# Patient Record
Sex: Female | Born: 1970 | Race: White | Hispanic: No | Marital: Married | Smoking: Never smoker
Health system: Southern US, Community
[De-identification: ages and names within clinical notes are randomized; demographics above are authoritative.]

## PROBLEM LIST (undated history)

## (undated) DIAGNOSIS — F32A Depression, unspecified: Secondary | ICD-10-CM

## (undated) DIAGNOSIS — D649 Anemia, unspecified: Secondary | ICD-10-CM

## (undated) HISTORY — PX: CHOLECYSTECTOMY: SHX55

---

## 1995-07-14 HISTORY — PX: BREAST EXCISIONAL BIOPSY: SUR124

## 2004-11-14 ENCOUNTER — Ambulatory Visit: Payer: Self-pay

## 2008-07-13 HISTORY — PX: BREAST BIOPSY: SHX20

## 2011-01-07 ENCOUNTER — Ambulatory Visit: Payer: Self-pay

## 2011-01-26 ENCOUNTER — Ambulatory Visit: Payer: Self-pay

## 2013-01-26 ENCOUNTER — Ambulatory Visit: Payer: Self-pay | Admitting: Orthopedic Surgery

## 2014-07-13 HISTORY — PX: BREAST BIOPSY: SHX20

## 2015-02-26 ENCOUNTER — Other Ambulatory Visit: Payer: Self-pay | Admitting: Unknown Physician Specialty

## 2015-02-26 DIAGNOSIS — N6321 Unspecified lump in the left breast, upper outer quadrant: Secondary | ICD-10-CM

## 2015-03-04 ENCOUNTER — Ambulatory Visit: Payer: Self-pay

## 2015-03-04 ENCOUNTER — Ambulatory Visit
Admission: RE | Admit: 2015-03-04 | Discharge: 2015-03-04 | Disposition: A | Discharge status: Home / Self Care | Payer: Self-pay | Source: Ambulatory Visit | Attending: Unknown Physician Specialty | Admitting: Unknown Physician Specialty

## 2015-03-04 DIAGNOSIS — N6321 Unspecified lump in the left breast, upper outer quadrant: Secondary | ICD-10-CM

## 2015-03-04 DIAGNOSIS — Z803 Family history of malignant neoplasm of breast: Secondary | ICD-10-CM | POA: Insufficient documentation

## 2015-03-04 DIAGNOSIS — N63 Unspecified lump in breast: Secondary | ICD-10-CM | POA: Insufficient documentation

## 2015-03-05 ENCOUNTER — Other Ambulatory Visit: Payer: Self-pay | Admitting: Unknown Physician Specialty

## 2015-03-05 DIAGNOSIS — R928 Other abnormal and inconclusive findings on diagnostic imaging of breast: Secondary | ICD-10-CM

## 2015-03-05 DIAGNOSIS — N63 Unspecified lump in unspecified breast: Secondary | ICD-10-CM

## 2015-03-06 ENCOUNTER — Other Ambulatory Visit: Payer: Self-pay | Admitting: Oncology

## 2015-03-06 ENCOUNTER — Other Ambulatory Visit: Payer: Self-pay

## 2015-03-06 ENCOUNTER — Ambulatory Visit: Payer: Self-pay | Attending: Oncology

## 2015-03-06 VITALS — BP 110/77 | HR 83 | Temp 99.4°F | Resp 18 | Ht 63.78 in | Wt 186.5 lb

## 2015-03-06 DIAGNOSIS — N63 Unspecified lump in unspecified breast: Secondary | ICD-10-CM

## 2015-03-06 DIAGNOSIS — Z0000 Encounter for general adult medical examination without abnormal findings: Secondary | ICD-10-CM

## 2015-03-06 DIAGNOSIS — R928 Other abnormal and inconclusive findings on diagnostic imaging of breast: Secondary | ICD-10-CM

## 2015-03-06 NOTE — Progress Notes (Addendum)
Subjective:     Patient ID: Kim Fischer, female   DOB: [birth year], 44 y.o.   MRN: [id number]  HPI   Review of Systems     Objective:   Physical Exam  Pulmonary/Chest: Right breast exhibits no inverted nipple, no mass, no nipple discharge, no skin change and no tenderness. Left breast exhibits no inverted nipple, no mass, no nipple discharge, no skin change and no tenderness. Breasts are asymmetrical.  Right breast 1 cup larger than left breast.  Genitourinary: No labial fusion. There is no rash, tenderness, lesion or injury on the right labia. There is no rash, tenderness, lesion or injury on the left labia. Uterus is not deviated, not enlarged, not fixed and not tender. Cervix exhibits friability. Right adnexum displays no mass, no tenderness and no fullness. Left adnexum displays no mass, no tenderness and no fullness. No erythema, tenderness or bleeding in the vagina. No foreign body around the vagina. No signs of injury around the vagina. No vaginal discharge found.    3 areas of excoriation on face of cervix vs. Nabothian cysts. Small amount of bleeding on specimen collection.       Assessment:     44 year old patient presents for BCCCP clinic visit after having [facility] mammogram yesterday.  .  No mass or lump palpated.  Patient screened, and meets BCCCP eligibility.  Patient does not have insurance, Medicare or Medicaid.  Handout given on Affordable Care Act.CBE unremarkableInstructed patient on breast self-exam using teach back method.  Pelvic exam reveals 3 excoriated areas on face of cervix vs. Nabothian cysts. Mild bleeding on specimen collection.     Plan:     Specimen collected for pap. Scheduled for left breast biopsy 03/06/16 at the [facility].

## 2015-03-07 ENCOUNTER — Ambulatory Visit: Payer: Self-pay

## 2015-03-07 ENCOUNTER — Ambulatory Visit: Admission: RE | Admit: 2015-03-07 | Payer: Self-pay | Source: Ambulatory Visit

## 2015-03-07 ENCOUNTER — Ambulatory Visit
Admission: RE | Admit: 2015-03-07 | Discharge: 2015-03-07 | Disposition: A | Discharge status: Home / Self Care | Payer: Self-pay | Source: Ambulatory Visit | Attending: Oncology | Admitting: Oncology

## 2015-03-07 DIAGNOSIS — N63 Unspecified lump in unspecified breast: Secondary | ICD-10-CM

## 2015-03-07 DIAGNOSIS — R928 Other abnormal and inconclusive findings on diagnostic imaging of breast: Secondary | ICD-10-CM

## 2015-03-08 LAB — SURGICAL PATHOLOGY

## 2015-03-12 LAB — PAP LB AND HPV HIGH-RISK
HPV, HIGH-RISK: NEGATIVE
PAP Smear Comment: 0

## 2015-03-19 NOTE — Progress Notes (Signed)
Received benign breast biopsy result.  Pap results ASCUS with negative HPV.  Per BCCCP guidelines patient would return to routine screening, but due to findings on pelvic exam, she is being referred for consult  to  Farrel Conners CNM at [facility].  Her appointment is scheduled for Monday March 25, 2015 at 8:30.  Patient aware of appointment.

## 2015-03-29 ENCOUNTER — Telehealth: Payer: Self-pay

## 2015-03-29 NOTE — Telephone Encounter (Signed)
Phoned patient as follow-up to abnormal cervical exam.  Per patient Farrel Conners NP confirmed nabothian cysts, and recommended repeat pap in 1 year. Will request notes from [facility] clinic.  Patient to schedule appointment for annual screening in one year.

## 2015-04-18 NOTE — Progress Notes (Signed)
Received letter from Farrel Conners CNM stating patient will need repeat pap in one year.

## 2016-01-27 ENCOUNTER — Other Ambulatory Visit: Payer: Self-pay | Admitting: Certified Nurse Midwife

## 2016-01-27 ENCOUNTER — Encounter: Payer: Self-pay | Admitting: *Deleted

## 2016-01-27 DIAGNOSIS — Z1231 Encounter for screening mammogram for malignant neoplasm of breast: Secondary | ICD-10-CM

## 2016-01-27 NOTE — Progress Notes (Signed)
  Oncology Nurse Navigator Documentation  Navigator Location: [facility] (01/27/16 1600) Navigator Encounter Type: Telephone (01/27/16 1600) Telephone: Incoming Call (01/27/16 1600)             Barriers/Navigation Needs: Coordination of Care (01/27/16 1600)   Interventions: Referrals (free mammo through our [facility]) (01/27/16 1600)                      Time Spent with Patient: 30 (01/27/16 1600)   Patient approved for a free mammogram through our [facility].  Message sent to scheduling in mammo.  Patient to call and scheduled an appointment.

## 2016-03-04 ENCOUNTER — Other Ambulatory Visit: Payer: Self-pay | Admitting: Certified Nurse Midwife

## 2016-03-04 ENCOUNTER — Ambulatory Visit
Admission: RE | Admit: 2016-03-04 | Discharge: 2016-03-04 | Disposition: A | Discharge status: Home / Self Care | Payer: Self-pay | Source: Ambulatory Visit | Attending: Certified Nurse Midwife | Admitting: Certified Nurse Midwife

## 2016-03-04 DIAGNOSIS — Z1231 Encounter for screening mammogram for malignant neoplasm of breast: Secondary | ICD-10-CM

## 2016-03-11 ENCOUNTER — Other Ambulatory Visit: Payer: Self-pay | Admitting: Certified Nurse Midwife

## 2016-03-11 DIAGNOSIS — R921 Mammographic calcification found on diagnostic imaging of breast: Secondary | ICD-10-CM

## 2016-03-26 ENCOUNTER — Ambulatory Visit
Admission: RE | Admit: 2016-03-26 | Discharge: 2016-03-26 | Disposition: A | Discharge status: Home / Self Care | Payer: Self-pay | Source: Ambulatory Visit | Attending: Certified Nurse Midwife | Admitting: Certified Nurse Midwife

## 2016-03-26 DIAGNOSIS — R921 Mammographic calcification found on diagnostic imaging of breast: Secondary | ICD-10-CM

## 2016-04-24 ENCOUNTER — Other Ambulatory Visit: Payer: Self-pay | Admitting: Certified Nurse Midwife

## 2016-04-24 DIAGNOSIS — R921 Mammographic calcification found on diagnostic imaging of breast: Secondary | ICD-10-CM

## 2016-04-28 ENCOUNTER — Encounter: Payer: Self-pay | Admitting: *Deleted

## 2016-04-28 NOTE — Progress Notes (Signed)
Received call from Denyse Dago at [facility] that the patient was interested in genetic testing, but was uninsured.  She wanted to know if we had any programs available and would I give the patient a call.  Talked to patient today.  Based on NCCN guidelines for unaffected patient, she still meets guidelines for testing since her mother was diagnosed with 2 primary breast cancers.  Reviewed statistics and that there is financial assistance through [employer].  She would have to complete a form and send a W-2 to see if she qualifies.  Explained that we typically send it in at the time of testing, and if not approved, the specimen can be destroyed by [facility].  Informed her that she would have to have a doc or CNM to have the results sent to.  Informed her that Dr. Grayland Ormond did see high risk patient, but that there would be charge, or she could follow with Dalia Heading, CNM and discuss with her.  Told her that if [facility] was not sure how to find the financial assistance info I would be glad to walk them through it.  She is to call if she has any questions or needs.

## 2016-09-24 ENCOUNTER — Ambulatory Visit
Admission: RE | Admit: 2016-09-24 | Discharge: 2016-09-24 | Disposition: A | Discharge status: Home / Self Care | Payer: Self-pay | Source: Ambulatory Visit | Attending: Certified Nurse Midwife | Admitting: Certified Nurse Midwife

## 2016-09-24 ENCOUNTER — Other Ambulatory Visit: Payer: Self-pay | Admitting: Certified Nurse Midwife

## 2016-09-24 DIAGNOSIS — R921 Mammographic calcification found on diagnostic imaging of breast: Secondary | ICD-10-CM

## 2018-02-01 ENCOUNTER — Other Ambulatory Visit: Payer: Self-pay | Admitting: Internal Medicine

## 2018-02-01 DIAGNOSIS — R921 Mammographic calcification found on diagnostic imaging of breast: Secondary | ICD-10-CM

## 2018-02-17 ENCOUNTER — Ambulatory Visit
Admission: RE | Admit: 2018-02-17 | Discharge: 2018-02-17 | Disposition: A | Discharge status: Home / Self Care | Payer: Self-pay | Source: Ambulatory Visit | Attending: Internal Medicine | Admitting: Internal Medicine

## 2018-02-17 DIAGNOSIS — R921 Mammographic calcification found on diagnostic imaging of breast: Secondary | ICD-10-CM | POA: Insufficient documentation

## 2019-05-12 ENCOUNTER — Other Ambulatory Visit: Payer: Self-pay | Admitting: Internal Medicine

## 2019-05-12 DIAGNOSIS — Z1231 Encounter for screening mammogram for malignant neoplasm of breast: Secondary | ICD-10-CM

## 2019-08-07 ENCOUNTER — Ambulatory Visit
Admission: RE | Admit: 2019-08-07 | Discharge: 2019-08-07 | Disposition: A | Discharge status: Home / Self Care | Payer: Managed Care, Other (non HMO) | Source: Ambulatory Visit | Attending: Internal Medicine | Admitting: Internal Medicine

## 2019-08-07 DIAGNOSIS — Z1231 Encounter for screening mammogram for malignant neoplasm of breast: Secondary | ICD-10-CM | POA: Diagnosis present

## 2020-10-23 ENCOUNTER — Other Ambulatory Visit: Payer: Self-pay | Admitting: Internal Medicine

## 2020-10-23 DIAGNOSIS — Z1231 Encounter for screening mammogram for malignant neoplasm of breast: Secondary | ICD-10-CM

## 2020-11-27 ENCOUNTER — Other Ambulatory Visit: Payer: Self-pay

## 2020-11-27 ENCOUNTER — Ambulatory Visit
Admission: RE | Admit: 2020-11-27 | Discharge: 2020-11-27 | Disposition: A | Discharge status: Home / Self Care | Payer: Managed Care, Other (non HMO) | Source: Ambulatory Visit | Attending: Internal Medicine | Admitting: Internal Medicine

## 2020-11-27 DIAGNOSIS — Z1231 Encounter for screening mammogram for malignant neoplasm of breast: Secondary | ICD-10-CM | POA: Insufficient documentation

## 2021-12-26 ENCOUNTER — Other Ambulatory Visit: Payer: Self-pay | Admitting: Internal Medicine

## 2021-12-26 DIAGNOSIS — Z1231 Encounter for screening mammogram for malignant neoplasm of breast: Secondary | ICD-10-CM

## 2022-01-22 ENCOUNTER — Ambulatory Visit
Admission: RE | Admit: 2022-01-22 | Discharge: 2022-01-22 | Disposition: A | Discharge status: Home / Self Care | Payer: 59 | Source: Ambulatory Visit | Attending: Internal Medicine | Admitting: Internal Medicine

## 2022-01-22 DIAGNOSIS — Z1231 Encounter for screening mammogram for malignant neoplasm of breast: Secondary | ICD-10-CM | POA: Diagnosis present

## 2022-01-23 ENCOUNTER — Ambulatory Visit (INDEPENDENT_AMBULATORY_CARE_PROVIDER_SITE_OTHER): Payer: 59 | Admitting: Plastic Surgery

## 2022-01-23 VITALS — BP 132/88 | HR 102 | Ht 62.0 in | Wt 198.2 lb

## 2022-01-23 DIAGNOSIS — M546 Pain in thoracic spine: Secondary | ICD-10-CM

## 2022-01-23 DIAGNOSIS — N62 Hypertrophy of breast: Secondary | ICD-10-CM

## 2022-01-23 DIAGNOSIS — M549 Dorsalgia, unspecified: Secondary | ICD-10-CM | POA: Diagnosis not present

## 2022-01-23 DIAGNOSIS — M542 Cervicalgia: Secondary | ICD-10-CM | POA: Diagnosis not present

## 2022-01-23 DIAGNOSIS — Z803 Family history of malignant neoplasm of breast: Secondary | ICD-10-CM

## 2022-01-23 NOTE — Progress Notes (Signed)
Referring Provider Danella Penton, MD [address] [facility] [place],  [place] [address]   CC:  Breast hypertrophy and back pain   Kim Fischer is an 51 y.o. female.  HPI:   The patient is a 51 y.o. female with a history of mammary hyperplasia for several years.  She has extremely large breasts causing symptoms that include the following: Back pain in the upper and lower back, including neck pain. She pulls or pins her bra straps to provide better lift and relief of the pressure and pain. She notices relief by holding her breast up manually.  Her shoulder straps cause grooves and pain and pressure that requires padding for relief. Pain medication is sometimes required with motrin and tylenol.  Activities that are hindered by enlarged breasts include: exercise and running.  She has tried supportive clothing as well as fitted bras without improvement.     Mammogram history: completed yeasterday [facility], normal.  Family history of breast cancer:  mother - deceased from breat cancer.  Tobacco use:  none.   The patient expresses the desire to pursue surgical intervention.  The BMI = 36.  Preoperative bra size = DDD cup.   The patient is also interested in abdominal liposuction, primarily centrally.   No Known Allergies  Outpatient Encounter Medications as of 01/23/2022  Medication Sig   ADVAIR DISKUS 100-50 MCG/ACT AEPB SMARTSIG:1 inhalation Via Inhaler Every 12 Hours   ALPRAZolam (XANAX) 1 MG tablet Take 1 mg by mouth at bedtime.   levothyroxine (SYNTHROID) 100 MCG tablet Take by mouth.   pantoprazole (PROTONIX) 40 MG tablet Take 40 mg by mouth daily.   pramipexole (MIRAPEX) 0.25 MG tablet TAKE 2 TABLETS (0.5 MG TOTAL) BY MOUTH NIGHTLY   No facility-administered encounter medications on file as of 01/23/2022.     No past medical history on file.  Past Surgical History:  Procedure Laterality Date   BREAST BIOPSY Right 2010   negative   BREAST BIOPSY  Left 2016   NEG   BREAST EXCISIONAL BIOPSY Right 1997   negative    Family History  Problem Relation Age of Onset   Breast cancer Mother 10       and again at 11    Social History   Social History Narrative   Not on file     Review of Systems General: Denies fevers, chills, weight loss CV: Denies chest pain, shortness of breath, palpitations   Physical Exam    01/23/2022    9:56 AM 03/06/2015   12:52 PM  Vitals with BMI  Height 5\' 2"  5' 3.78"  Weight 198 lbs 3 oz 186 lbs 8 oz  BMI 36.24 32.3  Systolic 132 110  Diastolic 88 77  Pulse 102 83    General:  No acute distress,  Alert and oriented, Non-Toxic, Normal speech and affect Breast: No easily palpable breast masses on physical exam, significant breast ptosis and macromastia. Her breasts are extremely large and fairly symmetric with the right larger.  She has hyperpigmentation of the inframammary area on both sides.  The sternal to nipple distance on the right is 36 cm and the left is 36 cm.  The IMF distance is 17 cm on the right and 16.5 cm on the left.  Base width is 19 bilaterally. Abdomen: Some adipose subcutaneous primarily centrally  Assessment/Plan   The patient has bilateral symptomatic macromastia.  She is a good candidate for a breast reduction.  She is  interested in pursuing surgical treatment.  She has tried supportive garments and fitted bras with no relief.  The details of breast reduction surgery were discussed.  I explained the procedure in detail along the with the expected scars.  The risks were discussed in detail and include bleeding, infection, damage to surrounding structures, need for additional procedures, nipple loss, change in nipple sensation, persistent pain, contour irregularities and asymmetries.  I explained that breast feeding is often not possible after breast reduction surgery.  We discussed the expected postoperative course with an overall recovery period of about 1 month.  She  demonstrated full understanding of all risks.  We discussed her personal risk factors that include high BMI.  The patient is interested in pursuing surgical treatment.  The estimated excess breast tissue to be removed at the time of surgery = 650 grams on the left and 650 grams on the right.   In addition the patient is a candidate for abdominal liposuction primarily central abdomen.  She is aware that liposuction would not be covered by insurance. Janne Napoleon 01/23/2022, 10:37 AM

## 2022-02-11 ENCOUNTER — Encounter: Payer: Self-pay | Admitting: *Deleted

## 2022-02-19 ENCOUNTER — Telehealth: Payer: Self-pay | Admitting: Plastic Surgery

## 2022-02-19 NOTE — Telephone Encounter (Signed)
T [facility] [phone]=  BIL Breast REd N62 does not need auth for [id number] approval # [id number]

## 2022-03-02 ENCOUNTER — Telehealth: Payer: Self-pay | Admitting: Plastic Surgery

## 2022-03-02 NOTE — Telephone Encounter (Signed)
[facility] - READY TO SCHEDULE SURGERY 

## 2022-04-02 NOTE — Progress Notes (Signed)
   Patient ID: Kim Fischer, female    DOB: 01/27/1971, 51 y.o.   MRN: [id number]  Chief Complaint  Patient presents with   Pre-op Exam      ICD-10-CM   1. Breast hypertrophy  N62        History of Present Illness: Kim Fischer is a 51 y.o.  female  with a history of mammary hyperplasia.  She presents for preoperative evaluation for upcoming procedure, bilateral breast reduction, scheduled for 04/14/2022 with Dr.  Luppens .  The patient has not had problems with anesthesia.  Previous cholecystectomy without complication from anesthesia.  She denies any personal or family history of blood clots or clotting disorder.  No personal history of cancer, MI, CVA, or other cardiopulmonary disease.  She does have mild exercise induced asthma, but well controlled.  She denies any smoking or other nicotine-containing products.  She confirms that she is a DDD cup and would like to be a C cup postoperatively.  She reports that she would prefer a small C, but understands limitations of surgery based on her current size.  She states that her right breast is much larger than the left and nipple is deviated outward.  Summary of Previous Visit: She was seen for initial consult on 01/23/2022 Dr. Luppens.  At that time, complained of symptomatic mammary hyperplasia.  BMI equals 36 kg/m.  Preoperative bra size equals DDD cup.  She also expressed interest in abdominal liposuction.  STN 36 cm each side.  Base width 19 cm.  Estimated excess breast tissue removed at time of surgical 650 g each side.  Patient was provided quote for abdominal liposuction which she understood would not be covered by insurance.  Current plan is just for the mammary reduction.  Could revisit liposuction at a later date.  Job: Works at church, predominantly a desk position.  Discussed 2 to 3 weeks STD with limitations upon return.  PMH Significant for: Macromastia, thyroid disorder, GERD, RLS.   Past Medical  History: Allergies: No Known Allergies  Current Medications:  Current Outpatient Medications:    ADVAIR DISKUS 100-50 MCG/ACT AEPB, SMARTSIG:1 inhalation Via Inhaler Every 12 Hours, Disp: , Rfl:    ALPRAZolam (XANAX) 1 MG tablet, Take 1 mg by mouth at bedtime., Disp: , Rfl:    ferrous gluconate (FERGON) 324 MG tablet, Take 324 mg by mouth every morning., Disp: , Rfl:    levothyroxine (SYNTHROID) 100 MCG tablet, Take by mouth., Disp: , Rfl:    pantoprazole (PROTONIX) 40 MG tablet, Take 40 mg by mouth daily., Disp: , Rfl:    pramipexole (MIRAPEX) 0.25 MG tablet, TAKE 2 TABLETS (0.5 MG TOTAL) BY MOUTH NIGHTLY, Disp: , Rfl:    venlafaxine XR (EFFEXOR-XR) 75 MG 24 hr capsule, Take 75 mg by mouth daily., Disp: , Rfl:   Past Medical Problems: No past medical history on file.  Past Surgical History: Past Surgical History:  Procedure Laterality Date   BREAST BIOPSY Right 2010   negative   BREAST BIOPSY Left 2016   NEG   BREAST EXCISIONAL BIOPSY Right 1997   negative    Social History: Social History   Socioeconomic History   Marital status: Married    Spouse name: Not on file   Number of children: Not on file   Years of education: Not on file   Highest education level: Not on file  Occupational History   Not on file  Tobacco Use   Smoking status: Never     Smokeless tobacco: Never  Substance and Sexual Activity   Alcohol use: Not on file   Drug use: Not on file   Sexual activity: Not on file  Other Topics Concern   Not on file  Social History Narrative   Not on file   Social Determinants of Health   Financial Resource Strain: Not on file  Food Insecurity: Not on file  Transportation Needs: Not on file  Physical Activity: Not on file  Stress: Not on file  Social Connections: Not on file  Intimate Partner Violence: Not on file    Family History: Family History  Problem Relation Age of Onset   Breast cancer Mother 98       and again at 30    Review of  Systems: ROS Denies any recent chest pain, difficulty breathing, leg swelling, fevers, or trauma.  Physical Exam: Vital Signs BP 125/67 (BP Location: Left Arm, Patient Position: Sitting, Cuff Size: Large)   Pulse 81   Ht 5\' 2"  (1.575 m)   Wt 198 lb 9.6 oz (90.1 kg)   SpO2 98%   BMI 36.32 kg/m   Physical Exam Constitutional:      General: Not in acute distress.    Appearance: Normal appearance. Not ill-appearing.  HENT:     Head: Normocephalic and atraumatic.  Eyes:     Pupils: Pupils are equal, round. Cardiovascular:     Rate and Rhythm: Normal rate.    Pulses: Normal pulses.  Pulmonary:     Effort: No respiratory distress or increased work of breathing.  Speaks in full sentences. Abdominal:     General: Abdomen is flat. No distension.   Musculoskeletal: Normal range of motion. No lower extremity swelling or edema.  Skin:    General: Skin is warm and dry.     Findings: No erythema or rash.  Neurological:     Mental Status: Alert and oriented to person, place, and time.  Psychiatric:        Mood and Affect: Mood normal.        Behavior: Behavior normal.    Assessment/Plan: The patient is scheduled for bilateral breast reduction with Dr. Erin Hearing.  Risks, benefits, and alternatives of procedure discussed, questions answered and consent obtained.    Smoking Status: Non-smoker. Last Mammogram: 01/2022; Results: BI-RADS Category 1: Negative.  Caprini Score: 4; Risk Factors include: Age, BMI greater than 25, and length of planned surgery. Recommendation for mechanical prophylaxis. Encourage early ambulation.   Pictures obtained: 01/23/2022  Post-op Rx sent to pharmacy: Oxycodone and Zofran.  Patient was provided with the General Surgical Risk consent document and Pain Medication Agreement prior to their appointment.  They had adequate time to read through the risk consent documents and Pain Medication Agreement. We also discussed them in person together during this preop  appointment. All of their questions were answered to their satisfaction.  Recommended calling if they have any further questions.  Risk consent form and Pain Medication Agreement to be scanned into patient's chart.  The risk that can be encountered with breast reduction were discussed and include the following but not limited to these:  Breast asymmetry, fluid accumulation, firmness of the breast, inability to breast feed, loss of nipple or areola, skin loss, decrease or no nipple sensation, fat necrosis of the breast tissue, bleeding, infection, healing delay.  There are risks of anesthesia, changes to skin sensation and injury to nerves or blood vessels.  The muscle can be temporarily or permanently injured.  You may  have an allergic reaction to tape, suture, glue, blood products which can result in skin discoloration, swelling, pain, skin lesions, poor healing.  Any of these can lead to the need for revisonal surgery or stage procedures.  A reduction has potential to interfere with diagnostic procedures.  Nipple or breast piercing can increase risks of infection.  This procedure is best done when the breast is fully developed.  Changes in the breast will continue to occur over time.  Pregnancy can alter the outcomes of previous breast reduction surgery, weight gain and weigh loss can also effect the long term appearance.     Electronically signed by: Sherryn Pollino, PA-C 04/03/2022 9:43 AM 

## 2022-04-02 NOTE — H&P (View-Only) (Signed)
Patient ID: [name], female    DOB: 1971/04/07, 51 y.o.   MRN: [id number]  Chief Complaint  Patient presents with   Pre-op Exam      ICD-10-CM   1. Breast hypertrophy  N62        History of Present Illness: Kim Fischer is a 51 y.o.  female  with a history of mammary hyperplasia.  She presents for preoperative evaluation for upcoming procedure, bilateral breast reduction, scheduled for 04/14/2022 with Dr.  Erin Hearing .  The patient has not had problems with anesthesia.  Previous cholecystectomy without complication from anesthesia.  She denies any personal or family history of blood clots or clotting disorder.  No personal history of cancer, MI, CVA, or other cardiopulmonary disease.  She does have mild exercise induced asthma, but well controlled.  She denies any smoking or other nicotine-containing products.  She confirms that she is a DDD cup and would like to be a C cup postoperatively.  She reports that she would prefer a small C, but understands limitations of surgery based on her current size.  She states that her right breast is much larger than the left and nipple is deviated outward.  Summary of Previous Visit: She was seen for initial consult on 01/23/2022 Dr. Erin Hearing.  At that time, complained of symptomatic mammary hyperplasia.  BMI equals 36 kg/m.  Preoperative bra size equals DDD cup.  She also expressed interest in abdominal liposuction.  STN 36 cm each side.  Base width 19 cm.  Estimated excess breast tissue removed at time of surgical 650 g each side.  Patient was provided quote for abdominal liposuction which she understood would not be covered by insurance.  Current plan is just for the mammary reduction.  Could revisit liposuction at a later date.  Job: Works at [employer], predominantly a Astronomer.  Discussed 2 to 3 weeks STD with limitations upon return.  PMH Significant for: Macromastia, thyroid disorder, GERD, RLS.   Past Medical  History: Allergies: No Known Allergies  Current Medications:  Current Outpatient Medications:    ADVAIR DISKUS 100-50 MCG/ACT AEPB, SMARTSIG:1 inhalation Via Inhaler Every 12 Hours, Disp: , Rfl:    ALPRAZolam (XANAX) 1 MG tablet, Take 1 mg by mouth at bedtime., Disp: , Rfl:    ferrous gluconate (FERGON) 324 MG tablet, Take 324 mg by mouth every morning., Disp: , Rfl:    levothyroxine (SYNTHROID) 100 MCG tablet, Take by mouth., Disp: , Rfl:    pantoprazole (PROTONIX) 40 MG tablet, Take 40 mg by mouth daily., Disp: , Rfl:    pramipexole (MIRAPEX) 0.25 MG tablet, TAKE 2 TABLETS (0.5 MG TOTAL) BY MOUTH NIGHTLY, Disp: , Rfl:    venlafaxine XR (EFFEXOR-XR) 75 MG 24 hr capsule, Take 75 mg by mouth daily., Disp: , Rfl:   Past Medical Problems: No past medical history on file.  Past Surgical History: Past Surgical History:  Procedure Laterality Date   BREAST BIOPSY Right 2010   negative   BREAST BIOPSY Left 2016   NEG   BREAST EXCISIONAL BIOPSY Right 1997   negative    Social History: Social History   Socioeconomic History   Marital status: Married    Spouse name: Not on file   Number of children: Not on file   Years of education: Not on file   Highest education level: Not on file  Occupational History   Not on file  Tobacco Use   Smoking status: Never  Smokeless tobacco: Never  Substance and Sexual Activity   Alcohol use: Not on file   Drug use: Not on file   Sexual activity: Not on file  Other Topics Concern   Not on file  Social History Narrative   Not on file   Social Determinants of Health   Financial Resource Strain: Not on file  Food Insecurity: Not on file  Transportation Needs: Not on file  Physical Activity: Not on file  Stress: Not on file  Social Connections: Not on file  Intimate Partner Violence: Not on file    Family History: Family History  Problem Relation Age of Onset   Breast cancer Mother 98       and again at 30    Review of  Systems: ROS Denies any recent chest pain, difficulty breathing, leg swelling, fevers, or trauma.  Physical Exam: Vital Signs BP 125/67 (BP Location: Left Arm, Patient Position: Sitting, Cuff Size: Large)   Pulse 81   Ht 5\' 2"  (1.575 m)   Wt 198 lb 9.6 oz (90.1 kg)   SpO2 98%   BMI 36.32 kg/m   Physical Exam Constitutional:      General: Not in acute distress.    Appearance: Normal appearance. Not ill-appearing.  HENT:     Head: Normocephalic and atraumatic.  Eyes:     Pupils: Pupils are equal, round. Cardiovascular:     Rate and Rhythm: Normal rate.    Pulses: Normal pulses.  Pulmonary:     Effort: No respiratory distress or increased work of breathing.  Speaks in full sentences. Abdominal:     General: Abdomen is flat. No distension.   Musculoskeletal: Normal range of motion. No lower extremity swelling or edema.  Skin:    General: Skin is warm and dry.     Findings: No erythema or rash.  Neurological:     Mental Status: Alert and oriented to person, place, and time.  Psychiatric:        Mood and Affect: Mood normal.        Behavior: Behavior normal.    Assessment/Plan: The patient is scheduled for bilateral breast reduction with Dr. Erin Hearing.  Risks, benefits, and alternatives of procedure discussed, questions answered and consent obtained.    Smoking Status: Non-smoker. Last Mammogram: 01/2022; Results: BI-RADS Category 1: Negative.  Caprini Score: 4; Risk Factors include: Age, BMI greater than 25, and length of planned surgery. Recommendation for mechanical prophylaxis. Encourage early ambulation.   Pictures obtained: 01/23/2022  Post-op Rx sent to pharmacy: Oxycodone and Zofran.  Patient was provided with the General Surgical Risk consent document and Pain Medication Agreement prior to their appointment.  They had adequate time to read through the risk consent documents and Pain Medication Agreement. We also discussed them in person together during this preop  appointment. All of their questions were answered to their satisfaction.  Recommended calling if they have any further questions.  Risk consent form and Pain Medication Agreement to be scanned into patient's chart.  The risk that can be encountered with breast reduction were discussed and include the following but not limited to these:  Breast asymmetry, fluid accumulation, firmness of the breast, inability to breast feed, loss of nipple or areola, skin loss, decrease or no nipple sensation, fat necrosis of the breast tissue, bleeding, infection, healing delay.  There are risks of anesthesia, changes to skin sensation and injury to nerves or blood vessels.  The muscle can be temporarily or permanently injured.  You may  have an allergic reaction to tape, suture, glue, blood products which can result in skin discoloration, swelling, pain, skin lesions, poor healing.  Any of these can lead to the need for revisonal surgery or stage procedures.  A reduction has potential to interfere with diagnostic procedures.  Nipple or breast piercing can increase risks of infection.  This procedure is best done when the breast is fully developed.  Changes in the breast will continue to occur over time.  Pregnancy can alter the outcomes of previous breast reduction surgery, weight gain and weigh loss can also effect the long term appearance.     Electronically signed by: Krista Blue, PA-C 04/03/2022 9:43 AM

## 2022-04-03 ENCOUNTER — Encounter: Payer: Self-pay | Admitting: Physician Assistant

## 2022-04-03 ENCOUNTER — Ambulatory Visit (INDEPENDENT_AMBULATORY_CARE_PROVIDER_SITE_OTHER): Payer: 59 | Admitting: Physician Assistant

## 2022-04-03 VITALS — BP 125/67 | HR 81 | Ht 62.0 in | Wt 198.6 lb

## 2022-04-03 DIAGNOSIS — N62 Hypertrophy of breast: Secondary | ICD-10-CM

## 2022-04-03 MED ORDER — OXYCODONE HCL 5 MG PO TABS: 5.0000 mg | ORAL_TABLET | Freq: Four times a day (QID) | ORAL | 0 refills | Status: AC | PRN

## 2022-04-03 MED ORDER — ONDANSETRON 4 MG PO TBDP: 4.0000 mg | ORAL_TABLET | Freq: Three times a day (TID) | ORAL | 0 refills | Status: DC | PRN

## 2022-04-06 ENCOUNTER — Encounter (HOSPITAL_BASED_OUTPATIENT_CLINIC_OR_DEPARTMENT_OTHER): Payer: Self-pay | Admitting: Plastic Surgery

## 2022-04-06 ENCOUNTER — Other Ambulatory Visit: Payer: Self-pay

## 2022-04-12 HISTORY — PX: REDUCTION MAMMAPLASTY: SUR839

## 2022-04-14 ENCOUNTER — Other Ambulatory Visit: Payer: Self-pay

## 2022-04-14 ENCOUNTER — Ambulatory Visit (HOSPITAL_BASED_OUTPATIENT_CLINIC_OR_DEPARTMENT_OTHER): Payer: 59 | Admitting: Anesthesiology

## 2022-04-14 ENCOUNTER — Ambulatory Visit (HOSPITAL_BASED_OUTPATIENT_CLINIC_OR_DEPARTMENT_OTHER)
Admission: RE | Admit: 2022-04-14 | Discharge: 2022-04-14 | Disposition: A | Discharge status: Home / Self Care | Payer: 59 | Attending: Plastic Surgery | Admitting: Plastic Surgery

## 2022-04-14 ENCOUNTER — Encounter (HOSPITAL_BASED_OUTPATIENT_CLINIC_OR_DEPARTMENT_OTHER): Payer: Self-pay | Admitting: Plastic Surgery

## 2022-04-14 ENCOUNTER — Encounter (HOSPITAL_BASED_OUTPATIENT_CLINIC_OR_DEPARTMENT_OTHER)
Admission: RE | Disposition: A | Discharge status: Home / Self Care | Payer: Self-pay | Source: Home / Self Care | Attending: Plastic Surgery

## 2022-04-14 DIAGNOSIS — K219 Gastro-esophageal reflux disease without esophagitis: Secondary | ICD-10-CM | POA: Insufficient documentation

## 2022-04-14 DIAGNOSIS — G2581 Restless legs syndrome: Secondary | ICD-10-CM | POA: Diagnosis not present

## 2022-04-14 DIAGNOSIS — N62 Hypertrophy of breast: Secondary | ICD-10-CM | POA: Insufficient documentation

## 2022-04-14 DIAGNOSIS — E669 Obesity, unspecified: Secondary | ICD-10-CM | POA: Insufficient documentation

## 2022-04-14 DIAGNOSIS — E039 Hypothyroidism, unspecified: Secondary | ICD-10-CM | POA: Insufficient documentation

## 2022-04-14 DIAGNOSIS — Z01818 Encounter for other preprocedural examination: Secondary | ICD-10-CM

## 2022-04-14 DIAGNOSIS — J45990 Exercise induced bronchospasm: Secondary | ICD-10-CM | POA: Diagnosis not present

## 2022-04-14 DIAGNOSIS — Z9049 Acquired absence of other specified parts of digestive tract: Secondary | ICD-10-CM | POA: Diagnosis not present

## 2022-04-14 DIAGNOSIS — Z6836 Body mass index (BMI) 36.0-36.9, adult: Secondary | ICD-10-CM | POA: Insufficient documentation

## 2022-04-14 HISTORY — PX: BREAST REDUCTION SURGERY: SHX8

## 2022-04-14 LAB — POCT PREGNANCY, URINE: Preg Test, Ur: NEGATIVE

## 2022-04-14 SURGERY — MAMMOPLASTY, REDUCTION
Anesthesia: General | Site: Breast | Laterality: Bilateral

## 2022-04-14 MED ORDER — HYDROMORPHONE HCL 1 MG/ML IJ SOLN
0.2500 mg | INTRAMUSCULAR | Status: DC | PRN
  Administered 2022-04-14: 0.25 mg via INTRAVENOUS
  Administered 2022-04-14: 0.5 mg via INTRAVENOUS

## 2022-04-14 MED ORDER — CHLORHEXIDINE GLUCONATE CLOTH 2 % EX PADS: 6.0000 | MEDICATED_PAD | Freq: Once | CUTANEOUS | Status: DC

## 2022-04-14 MED ORDER — HYDROMORPHONE HCL 1 MG/ML IJ SOLN
INTRAMUSCULAR | Status: AC
  Filled 2022-04-14: qty 1

## 2022-04-14 MED ORDER — HYDROMORPHONE HCL 1 MG/ML IJ SOLN
INTRAMUSCULAR | Status: AC
  Filled 2022-04-14: qty 0.5

## 2022-04-14 MED ORDER — BACITRACIN ZINC 500 UNIT/GM EX OINT
TOPICAL_OINTMENT | CUTANEOUS | Status: AC
  Filled 2022-04-14: qty 28.35

## 2022-04-14 MED ORDER — ONDANSETRON HCL 4 MG/2ML IJ SOLN: 4.0000 mg | Freq: Once | INTRAMUSCULAR | Status: DC | PRN

## 2022-04-14 MED ORDER — MIDAZOLAM HCL 5 MG/5ML IJ SOLN
INTRAMUSCULAR | Status: DC | PRN
  Administered 2022-04-14: 2 mg via INTRAVENOUS

## 2022-04-14 MED ORDER — LIDOCAINE-EPINEPHRINE 1 %-1:100000 IJ SOLN
INTRAMUSCULAR | Status: AC
  Filled 2022-04-14: qty 1

## 2022-04-14 MED ORDER — ACETAMINOPHEN 10 MG/ML IV SOLN
INTRAVENOUS | Status: DC | PRN
  Administered 2022-04-14: 1000 mg via INTRAVENOUS

## 2022-04-14 MED ORDER — ROCURONIUM BROMIDE 100 MG/10ML IV SOLN
INTRAVENOUS | Status: DC | PRN
  Administered 2022-04-14: 60 mg via INTRAVENOUS
  Administered 2022-04-14: 20 mg via INTRAVENOUS
  Administered 2022-04-14: 10 mg via INTRAVENOUS

## 2022-04-14 MED ORDER — BUPIVACAINE HCL (PF) 0.25 % IJ SOLN
INTRAMUSCULAR | Status: AC
  Filled 2022-04-14: qty 30

## 2022-04-14 MED ORDER — BUPIVACAINE HCL (PF) 0.5 % IJ SOLN
INTRAMUSCULAR | Status: AC
  Filled 2022-04-14: qty 30

## 2022-04-14 MED ORDER — MIDAZOLAM HCL 2 MG/2ML IJ SOLN
INTRAMUSCULAR | Status: AC
  Filled 2022-04-14: qty 2

## 2022-04-14 MED ORDER — BUPIVACAINE-EPINEPHRINE (PF) 0.25% -1:200000 IJ SOLN
INTRAMUSCULAR | Status: AC
  Filled 2022-04-14: qty 30

## 2022-04-14 MED ORDER — KETOROLAC TROMETHAMINE 30 MG/ML IJ SOLN: 30.0000 mg | Freq: Once | INTRAMUSCULAR | Status: DC | PRN

## 2022-04-14 MED ORDER — FENTANYL CITRATE (PF) 100 MCG/2ML IJ SOLN
INTRAMUSCULAR | Status: DC | PRN
  Administered 2022-04-14 (×2): 50 ug via INTRAVENOUS

## 2022-04-14 MED ORDER — DEXAMETHASONE SODIUM PHOSPHATE 10 MG/ML IJ SOLN
INTRAMUSCULAR | Status: AC
  Filled 2022-04-14: qty 1

## 2022-04-14 MED ORDER — OXYCODONE HCL 5 MG PO TABS
5.0000 mg | ORAL_TABLET | Freq: Once | ORAL | Status: AC | PRN
  Administered 2022-04-14: 5 mg via ORAL

## 2022-04-14 MED ORDER — ACETAMINOPHEN 160 MG/5ML PO SOLN: 325.0000 mg | ORAL | Status: DC | PRN

## 2022-04-14 MED ORDER — FENTANYL CITRATE (PF) 100 MCG/2ML IJ SOLN
INTRAMUSCULAR | Status: AC
  Filled 2022-04-14: qty 2

## 2022-04-14 MED ORDER — EPINEPHRINE PF 1 MG/ML IJ SOLN
INTRAMUSCULAR | Status: AC
  Filled 2022-04-14: qty 1

## 2022-04-14 MED ORDER — CEFAZOLIN SODIUM-DEXTROSE 2-4 GM/100ML-% IV SOLN
INTRAVENOUS | Status: AC
  Filled 2022-04-14: qty 100

## 2022-04-14 MED ORDER — PROPOFOL 10 MG/ML IV BOLUS
INTRAVENOUS | Status: AC
  Filled 2022-04-14: qty 20

## 2022-04-14 MED ORDER — MEPERIDINE HCL 25 MG/ML IJ SOLN: 6.2500 mg | INTRAMUSCULAR | Status: DC | PRN

## 2022-04-14 MED ORDER — LIDOCAINE HCL (CARDIAC) PF 100 MG/5ML IV SOSY
PREFILLED_SYRINGE | INTRAVENOUS | Status: DC | PRN
  Administered 2022-04-14: 100 mg via INTRAVENOUS

## 2022-04-14 MED ORDER — 0.9 % SODIUM CHLORIDE (POUR BTL) OPTIME
TOPICAL | Status: DC | PRN
  Administered 2022-04-14: 200 mL

## 2022-04-14 MED ORDER — DEXAMETHASONE SODIUM PHOSPHATE 4 MG/ML IJ SOLN
INTRAMUSCULAR | Status: DC | PRN
  Administered 2022-04-14: 10 mg via INTRAVENOUS

## 2022-04-14 MED ORDER — LACTATED RINGERS IV SOLN
INTRAVENOUS | Status: DC | PRN
  Administered 2022-04-14: 950 mL

## 2022-04-14 MED ORDER — HYDROMORPHONE HCL 1 MG/ML IJ SOLN
INTRAMUSCULAR | Status: DC | PRN
  Administered 2022-04-14 (×2): .5 mg via INTRAVENOUS

## 2022-04-14 MED ORDER — LACTATED RINGERS IV SOLN: INTRAVENOUS | Status: DC

## 2022-04-14 MED ORDER — OXYCODONE HCL 5 MG/5ML PO SOLN: 5.0000 mg | Freq: Once | ORAL | Status: AC | PRN

## 2022-04-14 MED ORDER — SUGAMMADEX SODIUM 200 MG/2ML IV SOLN
INTRAVENOUS | Status: DC | PRN
  Administered 2022-04-14: 200 mg via INTRAVENOUS

## 2022-04-14 MED ORDER — ACETAMINOPHEN 10 MG/ML IV SOLN
INTRAVENOUS | Status: AC
  Filled 2022-04-14: qty 100

## 2022-04-14 MED ORDER — ACETAMINOPHEN 325 MG PO TABS: 325.0000 mg | ORAL_TABLET | ORAL | Status: DC | PRN

## 2022-04-14 MED ORDER — LIDOCAINE 2% (20 MG/ML) 5 ML SYRINGE
INTRAMUSCULAR | Status: AC
  Filled 2022-04-14: qty 5

## 2022-04-14 MED ORDER — OXYCODONE HCL 5 MG PO TABS
ORAL_TABLET | ORAL | Status: AC
  Filled 2022-04-14: qty 1

## 2022-04-14 MED ORDER — ONDANSETRON HCL 4 MG/2ML IJ SOLN
INTRAMUSCULAR | Status: AC
  Filled 2022-04-14: qty 2

## 2022-04-14 MED ORDER — CEFAZOLIN SODIUM-DEXTROSE 2-4 GM/100ML-% IV SOLN
2.0000 g | INTRAVENOUS | Status: AC
  Administered 2022-04-14: 2 g via INTRAVENOUS

## 2022-04-14 MED ORDER — LIDOCAINE HCL (PF) 1 % IJ SOLN
INTRAMUSCULAR | Status: AC
  Filled 2022-04-14: qty 30

## 2022-04-14 MED ORDER — LIDOCAINE-EPINEPHRINE (PF) 1 %-1:200000 IJ SOLN
INTRAMUSCULAR | Status: AC
  Filled 2022-04-14: qty 30

## 2022-04-14 MED ORDER — PROPOFOL 10 MG/ML IV BOLUS
INTRAVENOUS | Status: DC | PRN
  Administered 2022-04-14: 160 mg via INTRAVENOUS

## 2022-04-14 MED ORDER — BUPIVACAINE-EPINEPHRINE (PF) 0.5% -1:200000 IJ SOLN
INTRAMUSCULAR | Status: AC
  Filled 2022-04-14: qty 30

## 2022-04-14 MED ORDER — ONDANSETRON HCL 4 MG/2ML IJ SOLN
INTRAMUSCULAR | Status: DC | PRN
  Administered 2022-04-14: 4 mg via INTRAVENOUS

## 2022-04-14 SURGICAL SUPPLY — 75 items
APL PRP STRL LF DISP 70% ISPRP (MISCELLANEOUS) ×2
APL SKNCLS STERI-STRIP NONHPOA (GAUZE/BANDAGES/DRESSINGS) ×2
BENZOIN TINCTURE PRP APPL 2/3 (GAUZE/BANDAGES/DRESSINGS) ×2 IMPLANT
BINDER BREAST LRG (GAUZE/BANDAGES/DRESSINGS) IMPLANT
BINDER BREAST MEDIUM (GAUZE/BANDAGES/DRESSINGS) IMPLANT
BINDER BREAST XLRG (GAUZE/BANDAGES/DRESSINGS) IMPLANT
BINDER BREAST XXLRG (GAUZE/BANDAGES/DRESSINGS) IMPLANT
BIOPATCH RED 1 DISK 7.0 (GAUZE/BANDAGES/DRESSINGS) IMPLANT
BLADE SURG 10 STRL SS (BLADE) ×4 IMPLANT
BLADE SURG 15 STRL LF DISP TIS (BLADE) ×1 IMPLANT
BLADE SURG 15 STRL SS (BLADE) ×1
BNDG GAUZE DERMACEA FLUFF 4 (GAUZE/BANDAGES/DRESSINGS) ×2 IMPLANT
BNDG GZE DERMACEA 4 6PLY (GAUZE/BANDAGES/DRESSINGS)
CANISTER SUCT 1200ML W/VALVE (MISCELLANEOUS) ×1 IMPLANT
CHLORAPREP W/TINT 26 (MISCELLANEOUS) ×2 IMPLANT
COVER BACK TABLE 60X90IN (DRAPES) ×1 IMPLANT
COVER MAYO STAND STRL (DRAPES) ×1 IMPLANT
DRAIN CHANNEL 15F RND FF W/TCR (WOUND CARE) IMPLANT
DRAPE LAPAROSCOPIC ABDOMINAL (DRAPES) ×1 IMPLANT
DRAPE UTILITY XL STRL (DRAPES) ×1 IMPLANT
DRESSING MEPILEX FLEX 4X4 (GAUZE/BANDAGES/DRESSINGS) ×2 IMPLANT
DRSG MEPILEX FLEX 4X4 (GAUZE/BANDAGES/DRESSINGS) ×2
DRSG MEPILEX POST OP 4X8 (GAUZE/BANDAGES/DRESSINGS) IMPLANT
ELECT COATED BLADE 2.86 ST (ELECTRODE) ×1 IMPLANT
ELECT REM PT RETURN 9FT ADLT (ELECTROSURGICAL) ×2
ELECTRODE REM PT RTRN 9FT ADLT (ELECTROSURGICAL) ×2 IMPLANT
EVACUATOR SILICONE 100CC (DRAIN) IMPLANT
GAUZE PAD ABD 8X10 STRL (GAUZE/BANDAGES/DRESSINGS) ×2 IMPLANT
GAUZE SPONGE 4X4 12PLY STRL (GAUZE/BANDAGES/DRESSINGS) ×2 IMPLANT
GAUZE XEROFORM 5X9 LF (GAUZE/BANDAGES/DRESSINGS) IMPLANT
GLOVE BIO SURGEON STRL SZ8 (GLOVE) IMPLANT
GLOVE BIOGEL M STRL SZ7.5 (GLOVE) ×1 IMPLANT
GLOVE BIOGEL PI IND STRL 7.0 (GLOVE) IMPLANT
GLOVE BIOGEL PI IND STRL 7.5 (GLOVE) ×1 IMPLANT
GLOVE BIOGEL PI IND STRL 8 (GLOVE) IMPLANT
GLOVE SURG SS PI 7.5 STRL IVOR (GLOVE) ×1 IMPLANT
GOWN STRL REUS W/ TWL LRG LVL3 (GOWN DISPOSABLE) ×1 IMPLANT
GOWN STRL REUS W/ TWL XL LVL3 (GOWN DISPOSABLE) IMPLANT
GOWN STRL REUS W/TWL LRG LVL3 (GOWN DISPOSABLE) ×2
GOWN STRL REUS W/TWL XL LVL3 (GOWN DISPOSABLE) ×4 IMPLANT
MARKER SKIN DUAL TIP RULER LAB (MISCELLANEOUS) IMPLANT
NDL FILTER BLUNT 18X1 1/2 (NEEDLE) ×1 IMPLANT
NDL HYPO 25X1 1.5 SAFETY (NEEDLE) IMPLANT
NDL SAFETY ECLIP 18X1.5 (MISCELLANEOUS) ×1 IMPLANT
NDL SPNL 18GX3.5 QUINCKE PK (NEEDLE) IMPLANT
NEEDLE FILTER BLUNT 18X1 1/2 (NEEDLE) ×1 IMPLANT
NEEDLE HYPO 25X1 1.5 SAFETY (NEEDLE) IMPLANT
NEEDLE SPNL 18GX3.5 QUINCKE PK (NEEDLE) ×1 IMPLANT
NS IRRIG 1000ML POUR BTL (IV SOLUTION) ×1 IMPLANT
PACK BASIN DAY SURGERY FS (CUSTOM PROCEDURE TRAY) ×1 IMPLANT
PENCIL SMOKE EVACUATOR (MISCELLANEOUS) ×2 IMPLANT
SLEEVE SCD COMPRESS KNEE MED (STOCKING) ×1 IMPLANT
SPONGE T-LAP 18X18 ~~LOC~~+RFID (SPONGE) ×3 IMPLANT
STAPLER INSORB 30 2030 C-SECTI (MISCELLANEOUS) ×1 IMPLANT
STAPLER VISISTAT 35W (STAPLE) ×1 IMPLANT
STRIP SUTURE WOUND CLOSURE 1/2 (MISCELLANEOUS) ×3 IMPLANT
SUT CHROMIC 4 0 PS 2 18 (SUTURE) IMPLANT
SUT MON AB 5-0 PS2 18 (SUTURE) ×1 IMPLANT
SUT PDS 3-0 CT2 (SUTURE) ×6
SUT PDS AB 4-0 SH 27 (SUTURE) ×4 IMPLANT
SUT PDS II 3-0 CT2 27 ABS (SUTURE) ×4 IMPLANT
SUT SILK 2 0 SH (SUTURE) IMPLANT
SUT SILK 3 0 SH CR/8 (SUTURE) IMPLANT
SUT VIC AB 2-0 SH 27 (SUTURE) ×12
SUT VIC AB 2-0 SH 27XBRD (SUTURE) ×4 IMPLANT
SUT VLOC 90 3-0 CLR P12 (SUTURE) ×2 IMPLANT
SYR 50ML LL SCALE MARK (SYRINGE) IMPLANT
SYR BULB IRRIG 60ML STRL (SYRINGE) ×1 IMPLANT
SYR CONTROL 10ML LL (SYRINGE) IMPLANT
TAPE MEASURE VINYL STERILE (MISCELLANEOUS) IMPLANT
TOWEL GREEN STERILE FF (TOWEL DISPOSABLE) ×2 IMPLANT
TUBE CONNECTING 20X1/4 (TUBING) ×1 IMPLANT
TUBING INFILTRATION IT-10001 (TUBING) ×1 IMPLANT
UNDERPAD 30X36 HEAVY ABSORB (UNDERPADS AND DIAPERS) ×2 IMPLANT
YANKAUER SUCT BULB TIP NO VENT (SUCTIONS) ×1 IMPLANT

## 2022-04-14 NOTE — Interval H&P Note (Signed)
History and Physical Interval Note:  04/14/2022 7:20 AM  Kim Fischer  has presented today for surgery, with the diagnosis of Bilateral Breast REduction.  The various methods of treatment have been discussed with the patient and family. After consideration of risks, benefits and other options for treatment, the patient has consented to  Procedure(s): MAMMARY REDUCTION  (BREAST) (Bilateral) as a surgical intervention.  The patient's history has been reviewed, patient examined, no change in status, stable for surgery.  I have reviewed the patient's chart and labs.  Questions were answered to the patient's satisfaction.     [name]

## 2022-04-14 NOTE — Transfer of Care (Signed)
Immediate Anesthesia Transfer of Care Note  Patient: Kim Fischer  Procedure(s) Performed: BILATERAL MAMMARY REDUCTION  (BREAST) (Bilateral: Breast)  Patient Location: PACU  Anesthesia Type:General  Level of Consciousness: awake, alert  and oriented  Airway & Oxygen Therapy: Patient Spontanous Breathing and Patient connected to nasal cannula oxygen  Post-op Assessment: Report given to RN and Post -op Vital signs reviewed and stable  Post vital signs: Reviewed and stable  Last Vitals:  Vitals Value Taken Time  BP 150/91 04/14/22 1042  Temp    Pulse 103 04/14/22 1044  Resp 18 04/14/22 1044  SpO2 97 % 04/14/22 1044  Vitals shown include unvalidated device data.  Last Pain:  Vitals:   04/14/22 2992  TempSrc: Oral  PainSc: 0-No pain      Patients Stated Pain Goal: 3 (42/68/34 1962)  Complications: No notable events documented.

## 2022-04-14 NOTE — Anesthesia Postprocedure Evaluation (Signed)
Anesthesia Post Note  Patient: Kim Fischer  Procedure(s) Performed: BILATERAL MAMMARY REDUCTION  (BREAST) (Bilateral: Breast)     Patient location during evaluation: Phase II Anesthesia Type: General Level of consciousness: awake Pain management: pain level controlled Respiratory status: spontaneous breathing Cardiovascular status: stable Postop Assessment: no apparent nausea or vomiting Anesthetic complications: no   No notable events documented.  Last Vitals:  Vitals:   04/14/22 1115 04/14/22 1200  BP: (!) 146/78 (!) 151/80  Pulse: (!) 101 96  Resp: 16 16  Temp:  (!) 36.4 C  SpO2: 96% 95%    Last Pain:  Vitals:   04/14/22 1200  TempSrc:   PainSc: 3                  [employer]

## 2022-04-14 NOTE — Anesthesia Procedure Notes (Signed)
Procedure Name: Intubation Date/Time: 04/14/2022 7:34 AM  Performed by: Bufford Spikes, CRNAPre-anesthesia Checklist: Patient identified, Emergency Drugs available, Suction available and Patient being monitored Patient Re-evaluated:Patient Re-evaluated prior to induction Oxygen Delivery Method: Circle system utilized Preoxygenation: Pre-oxygenation with 100% oxygen Induction Type: IV induction Ventilation: Mask ventilation without difficulty Laryngoscope Size: Miller and 2 Grade View: Grade II Tube type: Oral Tube size: 7.0 mm Number of attempts: 1 Airway Equipment and Method: Stylet and Oral airway Placement Confirmation: ETT inserted through vocal cords under direct vision, positive ETCO2 and breath sounds checked- equal and bilateral Secured at: 21 cm Tube secured with: Tape Dental Injury: Teeth and Oropharynx as per pre-operative assessment

## 2022-04-14 NOTE — Op Note (Addendum)
Operative Note   DATE OF OPERATION: 04/14/2022  LOCATION: [facility]   SURGICAL DEPARTMENT: Plastic Surgery  PREOPERATIVE DIAGNOSES: Bilateral symptomatic macromastia.  POSTOPERATIVE DIAGNOSES:  same  PROCEDURE: Bilateral breast reduction with superomedial pedicle.  SURGEON: [name], MD  ASSISTANT: Dr. Jeanann Lewandowsky, MD, Krista Blue, PAC, Lenn Sink, [facility]  ANESTHESIA: General.  COMPLICATIONS: None.   INDICATIONS FOR PROCEDURE:  The patient, Kim Fischer is a 51 y.o. female born on Jul 10, 1971, is here for treatment of bilateral symptomatic macromastia. MRN: [id number]  CONSENT:  Informed consent was obtained directly from the patient. Risks, benefits and alternatives were fully discussed. Specific risks including but not limited to bleeding, infection, hematoma, seroma, scarring, pain, infection, contracture, asymmetry, wound healing problems, and need for further surgery were all discussed. The patient did have an ample opportunity to have questions answered to satisfaction.   DESCRIPTION OF PROCEDURE:  The patient was marked preoperatively for a Wise pattern skin excision.  The patient was taken to the operating room. SCDs were placed and antibiotics were given. General anesthesia was administered.The patient's operative site was prepped and draped in a sterile fashion. A time out was performed and all information was confirmed to be correct.  Right Breast: The breast was infiltrated with tumescent solution to help with hemostasis.  The nipple was marked with a cookie cutter.  A superomedial pedicle was drawn out with the base of at least 8 cm in size.  A breast tourniquet was then applied and the pedicle was de-epithelialized.  Breast tourniquet was then let down and all incisions were made with a 10 blade.  The pedicle was then isolated down to the chest wall with cautery and the excision was performed removing tissue primarily inferiorly and laterally.   Hemostasis was obtained and the wound was stapled closed.  Left breast:  The breast was infiltrated with tumescent solution to help with hemostasis.  The nipple was marked with a cookie cutter.  A superomedial pedicle was drawn out with the base of at least 8 cm in size.  A breast tourniquet was then applied and the pedicle was de-epithelialized.  Breast tourniquet was then let down and all incisions were made with a 10 blade.  The pedicle was then isolated down to the chest wall with cautery and the excision was performed removing tissue primarily inferiorly and laterally.  Hemostasis was obtained and the wound was stapled closed.  Patient was then set up to check for size and symmetry.  Minor modifications were made.  This resulted in a total of 1068 g removed from the right side and 803 g removed from the left side.  The inframammary incision was closed with a combination of buried in-sorb staples, 3-0 PDS and a running V-lock 90.  The vertical and periareolar limbs were closed with interrupted buried 4-0 PDS and a running V-lock 90 suture.  Steri-Strips were then applied along with a soft dressing and breast binder.  Jeanann Lewandowsky, MD assisted throughout the case.  The MD was essential in retraction and counter traction when needed to make the case progress smoothly.  This retraction and assistance made it possible to see the tissue planes for the procedure.  The assistance was needed for hemostasis, tissue re-approximation and closure of the incision site.    The patient tolerated the procedure well.  There were no complications. The patient was allowed to wake from anesthesia, extubated and taken to the recovery room in satisfactory condition.  I was present for  the entire procedure.

## 2022-04-14 NOTE — Discharge Instructions (Addendum)
Activity: Avoid strenuous activity.  No lifting, pushing, or pulling greater than 15 pounds.  Diet: No restrictions.  Try to optimize nutrition with plenty of proteins, fruits, and vegetables to improve healing.   Wound Care: Leave breast binder on for three days.  You may then shower normally. Leave the bandages overlying the Steri-Strips in place. After a few days, you may transition to a front-clipping/front-zipping compressive sports bra. You may continue to use the breast binder if that is preferred. Use gauze, ABD pads, or maxi pads if you have any incisional drainage.  Follow-Up: Scheduled for next week.  Things to watch for:  Call the office if you experience fever, chills, intractable vomiting, or significant bleeding.  Mild wound drainage is common after breast reduction surgery and should not be cause for alarm.   May take Tylenol after 2pm, if needed.   Post Anesthesia Home Care Instructions  Activity: Get plenty of rest for the remainder of the day. A responsible individual must stay with you for 24 hours following the procedure.  For the next 24 hours, DO NOT: -Drive a car -Paediatric nurse -Drink alcoholic beverages -Take any medication unless instructed by your physician -Make any legal decisions or sign important papers.  Meals: Start with liquid foods such as gelatin or soup. Progress to regular foods as tolerated. Avoid greasy, spicy, heavy foods. If nausea and/or vomiting occur, drink only clear liquids until the nausea and/or vomiting subsides. Call your physician if vomiting continues.  Special Instructions/Symptoms: Your throat may feel dry or sore from the anesthesia or the breathing tube placed in your throat during surgery. If this causes discomfort, gargle with warm salt water. The discomfort should disappear within 24 hours.  If you had a scopolamine patch placed behind your ear for the management of post- operative nausea and/or vomiting:  1. The  medication in the patch is effective for 72 hours, after which it should be removed.  Wrap patch in a tissue and discard in the trash. Wash hands thoroughly with soap and water. 2. You may remove the patch earlier than 72 hours if you experience unpleasant side effects which may include dry mouth, dizziness or visual disturbances. 3. Avoid touching the patch. Wash your hands with soap and water after contact with the patch.

## 2022-04-14 NOTE — Anesthesia Preprocedure Evaluation (Signed)
Anesthesia Evaluation  Patient identified by MRN, date of birth, ID band Patient awake    Reviewed: Allergy & Precautions, NPO status , Patient's Chart, lab work & pertinent test results  Airway Mallampati: II       Dental no notable dental hx.    Pulmonary neg pulmonary ROS,    Pulmonary exam normal        Cardiovascular negative cardio ROS Normal cardiovascular exam     Neuro/Psych PSYCHIATRIC DISORDERS Anxiety Depression negative neurological ROS     GI/Hepatic Neg liver ROS, GERD  ,  Endo/Other  Hypothyroidism   Renal/GU negative Renal ROS     Musculoskeletal negative musculoskeletal ROS (+)   Abdominal (+) + obese,   Peds  Hematology   Anesthesia Other Findings   Reproductive/Obstetrics negative OB ROS                             Anesthesia Physical Anesthesia Plan  ASA: 2  Anesthesia Plan: General   Post-op Pain Management: Dilaudid IV   Induction: Intravenous  PONV Risk Score and Plan: Ondansetron, Dexamethasone and Midazolam  Airway Management Planned: Oral ETT  Additional Equipment: None  Intra-op Plan:   Post-operative Plan: Extubation in OR  Informed Consent: I have reviewed the patients History and Physical, chart, labs and discussed the procedure including the risks, benefits and alternatives for the proposed anesthesia with the patient or authorized representative who has indicated his/her understanding and acceptance.     Dental advisory given  Plan Discussed with: CRNA  Anesthesia Plan Comments:         Anesthesia Quick Evaluation

## 2022-04-15 ENCOUNTER — Encounter (HOSPITAL_BASED_OUTPATIENT_CLINIC_OR_DEPARTMENT_OTHER): Payer: Self-pay | Admitting: Plastic Surgery

## 2022-04-16 LAB — SURGICAL PATHOLOGY

## 2022-04-20 ENCOUNTER — Ambulatory Visit (INDEPENDENT_AMBULATORY_CARE_PROVIDER_SITE_OTHER): Payer: 59 | Admitting: Plastic Surgery

## 2022-04-20 DIAGNOSIS — N62 Hypertrophy of breast: Secondary | ICD-10-CM

## 2022-04-20 NOTE — Progress Notes (Signed)
Patient is status post bilateral breast reduction on 04/14/2022.  She is overall doing well without complaints.  Physical exam Good symmetry in breast shape but does have some venous congestion right nipple.  Assessment and plan Discussed venous congestion of the right nipple with the patient.  We will continue to monitor her carefully.  Recommend Xeroform dressing to bilateral nipple areolar complex.  We discussed that the very lateral placement the dimple may have made her pedicle longer than typical resulting in some congestion.  The patient will follow-up in 2 weeks.

## 2022-04-28 ENCOUNTER — Telehealth: Payer: Self-pay

## 2022-04-28 NOTE — Telephone Encounter (Signed)
Pt called in stating her R breast and nipple are firm. Per Donna Christen, the venous congestion was noted at Dr. Erin Hearing' post-op visit. Pt is to keep the nipple moist with the xeroform dressing changes daily and using compression 24/7. Informed pt if she starts to have excessive drainage, nausea, vomiting, fever to give [place] a call to be seen earlier, but for now, the Monday appointment is fine. Pt conveyed understanding.

## 2022-05-04 ENCOUNTER — Encounter: Payer: 59 | Admitting: Surgical

## 2022-05-04 ENCOUNTER — Ambulatory Visit (INDEPENDENT_AMBULATORY_CARE_PROVIDER_SITE_OTHER): Payer: 59 | Admitting: Physician Assistant

## 2022-05-04 DIAGNOSIS — Z9889 Other specified postprocedural states: Secondary | ICD-10-CM

## 2022-05-04 NOTE — Progress Notes (Signed)
Patient is a very pleasant 51 year old female with PMH of macromastia s/p bilateral breast reduction performed 04/14/2022 Dr. Erin Hearing who presents to clinic for postoperative follow-up.  Reviewed operative report and 1068 g was removed from right side, 803 g removed from the left side.  Right side was much larger with outward deviated areola preoperatively.  She was an [facility] pedicle reduction.  She was last seen here in clinic on 04/20/2022.  At that time, there appeared to be venous congestion involving the right nipple and plan was for Xeroform dressings bilaterally.  Today, she tells me that she is concerned with the progression of her right nipple complication.  She is accompanied by her husband at bedside.  She states that she acquired boxes of Xeroform and has been dressing her right nipple with multiple pieces, changed 2-3 times daily.  She has been covering the Xeroform with a nonadherent pad.  She has also been doing a Xeroform dressing followed by a nonadherent pad to the left nipple, as well.  She reports that there has been malodor at dressing changes and that she has considerable yellowish drainage from the right nipple.  Patient states that it turned from a darkened appearance to black shortly after her last encounter.  She wanted to ensure that the appropriate steps are being taken and that there was no concern for infection or other urgent/emergent process.  She denies any leg swelling, fevers, pain, redness, or other symptoms.  On physical exam, she does appear to have right-sided nipple necrosis.  There is slough at the periphery of the NAC and centrally there appears to be a darkened eschar.  Small, less than 1 cm incisional wounds noted at superior aspect of vertical limb right breast.  Otherwise, remainder of right breast incisions appear to be healing well.  No surrounding erythema or induration to right breast.  No active drainage, but moderate amount of slough and nonviable tissue at right  NAC.  Inferior aspect of right breast has discrete area of firmness that is mildly tender, likely fat necrosis.  Left breast NAC is healthy, viable.  Excellent shape and symmetry to the breasts.  Scattered protruding INSORB staples are removed from inframammary incision on the left breast.  Small, less than 1 cm nondraining incisional wound over medial aspect of left inframammary incision.    Discussed with patient the expected course of healing for her right nipple necrosis.  Informed her that it would likely get worse before it gets better.  The malodor that she appreciates at time of dressing changes is due to the localized tissue death.  Anticipate that the entire area will slough off resulting in moderate amount of drainage and dressing changes.  There is no evidence concerning for infection on exam.  Applied thin film of Vaseline followed by Xeroform, 4 x 4 gauze, and ABD pad to the right NAC.  Thin film of Vaseline to the incisions on left breast.  Dressing to be secured with her compressive bra.    Continued activity modifications and compressive garments.  She is not requiring any pain medications and is otherwise doing well postoperatively.  Plan for follow-up in 2 weeks, but she can certainly call the clinic and return sooner if needed.  Picture(s) obtained of the patient and placed in the chart were with the patient's or guardian's permission.

## 2022-05-05 ENCOUNTER — Ambulatory Visit: Payer: 59 | Admitting: Surgical

## 2022-05-06 ENCOUNTER — Ambulatory Visit: Payer: 59 | Admitting: Plastic Surgery

## 2022-05-10 ENCOUNTER — Encounter: Payer: Self-pay | Admitting: Plastic Surgery

## 2022-05-10 ENCOUNTER — Telehealth: Payer: Self-pay | Admitting: Student

## 2022-05-10 NOTE — Telephone Encounter (Signed)
Patient called the on-call service today with concerns for infection to her right breast.  Patient underwent bilateral breast reduction on 04/14/2022 Dr. Erin Hearing.  Patient unfortunately developed right sided nipple necrosis.  She states that last night she felt like she was running a fever.  She checked her temperature and she said that it is just under 100 F and reports it has not gone over 100. Patient denies any nausea or vomiting.  She states she feels a little tired but otherwise feels okay.  She denies any dizziness or lightheadedness.    Patient does report that she feels there is some worsening redness to her right breast. She reports some discomfort the area but denies any significantly worsening pain. She reports she has been experiencing a lot of drainage in which she has to change overlying ABD pads about 3 times a day.  She reports the drainage is a yellowish/pinkish drainage.  She does report she noticed some blood on her dressings, but did not notice any active bleeding at her last dressing change.  She denies any purulent drainage.  I reviewed the picture patient sent via [place]. Right NAC necrosis is noted with some yellowish exudate. It appears there is a small open wound at the medial aspect of the NAC. It appears there is some redness to the superior / medial aspect of the NAC. It looks fairly mild from what I can tell from the picture.   I discussed with the patient that I would like her to come to the clinic tomorrow to be evaluated. Patient expressed understanding and states she would be able to come in.   I discussed with the patient that she should closely monitor the area of concern and her symptoms.  I discussed with the patient that if she runs fever greater than 101, notices any worsening redness, purulent drainage, increased pain or any worsening symptoms that she should call the on-call service back. I discussed with the patient that if she has any questions or concerns before  her visit tomorrow she can call back. Patient expressed understanding.

## 2022-05-11 ENCOUNTER — Ambulatory Visit (INDEPENDENT_AMBULATORY_CARE_PROVIDER_SITE_OTHER): Payer: 59 | Admitting: Student

## 2022-05-11 ENCOUNTER — Encounter: Payer: Self-pay | Admitting: Student

## 2022-05-11 VITALS — BP 127/91 | HR 90 | Temp 98.6°F

## 2022-05-11 DIAGNOSIS — Z9889 Other specified postprocedural states: Secondary | ICD-10-CM

## 2022-05-11 MED ORDER — COLLAGENASE 250 UNIT/GM EX OINT: 1.0000 | TOPICAL_OINTMENT | Freq: Every day | CUTANEOUS | 0 refills | Status: DC

## 2022-05-11 NOTE — Progress Notes (Cosign Needed Addendum)
Patient is a 51 year old female who underwent bilateral breast reduction with Dr. Erin Hearing on 04/14/2022.  Patient presents to the clinic today with concerns of redness to her breast.  Patient was last seen in the clinic on 05/04/2022.  At that visit, patient had concerns about the progression of her right nipple.  On exam, she was found to have right-sided nipple necrosis.  There were also less than 1 cm incisional wounds noted at the superior aspect of the vertical limb on the right breast.  There is no active drainage noted on exam, but there was a moderate amount of slough and nonviable tissue at the right NAC.  Plan was for patient to continue to apply Vaseline followed by Xeroform, gauze and an ABD pad to the right breast.  Patient called the on-call service yesterday with concerns of new redness to her right breast.  She denied any fevers greater than 100 F, any purulent drainage, any nausea or vomiting.  Plan was for patient to follow-up in the clinic today to be evaluated.  Today, patient reports she is doing okay.  She states that she feels a little bit tired but otherwise feels okay.  She denied any new fevers since yesterday.  She denied any nausea or vomiting.  Patient reports that she sometimes has burning sensation to her right breast where the necrosis is.  She denies any new drainage.  Vitals were obtained at today's visit.  Patient is afebrile.  Vitals are stable.  Chaperone present on exam.  On exam, patient is sitting upright in no acute distress.  Breasts are fairly symmetrical bilaterally.  NAC to the left breast appears viable.  There are a 2 small superficial wounds noted to the vertical limb incision and a small pinpoint wound noted to lateral inframammary.  Incisions are otherwise intact.  There are no signs of infection on exam.  There are 2 areas of firmness to the left breast that appear consistent with fat necrosis.  To the right breast, there is right-sided NAC necrosis  noted.  The area of necrosis is approximately 3.5 cm x 3 cm x 1 cm.  There is blackened eschar noted centrally with yellowish exudate.  There is an opening to the wound to the medial aspect of the NAC.  There appears to be viable fat deep to the blackened eschar.  There does appear to be some irritation noted peripherally to the medial aspect of the right NAC.  There is no active drainage noted on exam.  There is not appear to be any sign of infection on exam.  There is a small superficial wound noted to the superior aspect of the vertical limb incision.  There is a small area of irritation to the medial aspect of the inframammary incision.  Incision is otherwise intact.  There are some areas of firmness to the right breast that appear to be consistent with fat necrosis.  Dr. Erin Hearing also had the opportunity to examine the patient.  I discussed with the patient that she can put collagenase ointment over the right NAC necrosis daily to help slough the area off.  I discussed with the patient that if for some reason she cannot get the collagenase or it is too expensive, she can continue to put Vaseline over the area.  I discussed that she can place Xeroform over this.  I discussed with the patient that she could also put an ABD pad over the area to help with the drainage.  I offered to  put a prism order in for Xeroform and ABD pads, but patient states that she is able to easily obtain these and refused the prism order at this time.  I discussed with the patient that I will send the collagenase into her pharmacy.  Patient expressed her understanding.  I discussed the patient that she can apply Vaseline to the wounds to her left breast incision sites.  Patient expressed understanding.  I instructed the patient to continue to monitor the area.  I discussed with her that if she develops any fevers, chills, worsening redness, purulent drainage, or any worsening symptoms or concerns that she should let us know.   Patient expressed understanding.  Patient to follow-up next week.  I instructed the patient to call in the meantime if she has any questions or concerns.

## 2022-05-15 ENCOUNTER — Encounter (HOSPITAL_COMMUNITY): Payer: Self-pay | Admitting: Plastic Surgery

## 2022-05-15 ENCOUNTER — Ambulatory Visit (INDEPENDENT_AMBULATORY_CARE_PROVIDER_SITE_OTHER): Payer: 59 | Admitting: Plastic Surgery

## 2022-05-15 ENCOUNTER — Ambulatory Visit (INDEPENDENT_AMBULATORY_CARE_PROVIDER_SITE_OTHER): Payer: 59 | Admitting: Student

## 2022-05-15 ENCOUNTER — Ambulatory Visit (HOSPITAL_BASED_OUTPATIENT_CLINIC_OR_DEPARTMENT_OTHER): Payer: 59 | Admitting: Certified Registered Nurse Anesthetist

## 2022-05-15 ENCOUNTER — Encounter: Payer: Self-pay | Admitting: Student

## 2022-05-15 ENCOUNTER — Ambulatory Visit (HOSPITAL_COMMUNITY): Payer: 59 | Admitting: Certified Registered Nurse Anesthetist

## 2022-05-15 ENCOUNTER — Other Ambulatory Visit: Payer: Self-pay

## 2022-05-15 ENCOUNTER — Inpatient Hospital Stay (HOSPITAL_COMMUNITY)
Admission: AD | Admit: 2022-05-15 | Discharge: 2022-05-18 | DRG: 902 | Disposition: A | Discharge status: Home / Self Care | Payer: 59 | Source: Ambulatory Visit | Attending: Plastic Surgery | Admitting: Plastic Surgery

## 2022-05-15 ENCOUNTER — Encounter (HOSPITAL_COMMUNITY)
Admission: AD | Disposition: A | Discharge status: Home / Self Care | Payer: Self-pay | Source: Ambulatory Visit | Attending: Plastic Surgery

## 2022-05-15 VITALS — BP 137/83 | HR 96 | Temp 98.2°F

## 2022-05-15 DIAGNOSIS — Z79899 Other long term (current) drug therapy: Secondary | ICD-10-CM

## 2022-05-15 DIAGNOSIS — I96 Gangrene, not elsewhere classified: Secondary | ICD-10-CM | POA: Diagnosis not present

## 2022-05-15 DIAGNOSIS — T8130XA Disruption of wound, unspecified, initial encounter: Principal | ICD-10-CM | POA: Diagnosis present

## 2022-05-15 DIAGNOSIS — Z7984 Long term (current) use of oral hypoglycemic drugs: Secondary | ICD-10-CM

## 2022-05-15 DIAGNOSIS — Z79890 Hormone replacement therapy (postmenopausal): Secondary | ICD-10-CM

## 2022-05-15 DIAGNOSIS — N9989 Other postprocedural complications and disorders of genitourinary system: Secondary | ICD-10-CM

## 2022-05-15 DIAGNOSIS — E669 Obesity, unspecified: Secondary | ICD-10-CM | POA: Diagnosis present

## 2022-05-15 DIAGNOSIS — E039 Hypothyroidism, unspecified: Secondary | ICD-10-CM | POA: Diagnosis present

## 2022-05-15 DIAGNOSIS — Z6836 Body mass index (BMI) 36.0-36.9, adult: Secondary | ICD-10-CM

## 2022-05-15 DIAGNOSIS — T8131XA Disruption of external operation (surgical) wound, not elsewhere classified, initial encounter: Secondary | ICD-10-CM

## 2022-05-15 DIAGNOSIS — S21001A Unspecified open wound of right breast, initial encounter: Secondary | ICD-10-CM | POA: Diagnosis present

## 2022-05-15 DIAGNOSIS — Y848 Other medical procedures as the cause of abnormal reaction of the patient, or of later complication, without mention of misadventure at the time of the procedure: Secondary | ICD-10-CM | POA: Diagnosis present

## 2022-05-15 HISTORY — PX: DEBRIDEMENT AND CLOSURE WOUND: SHX5614

## 2022-05-15 HISTORY — DX: Anemia, unspecified: D64.9

## 2022-05-15 HISTORY — DX: Depression, unspecified: F32.A

## 2022-05-15 HISTORY — PX: APPLICATION OF WOUND VAC: SHX5189

## 2022-05-15 LAB — CBC
HCT: 34.7 % — ABNORMAL LOW (ref 36.0–46.0)
Hemoglobin: 11.3 g/dL — ABNORMAL LOW (ref 12.0–15.0)
MCH: 25.4 pg — ABNORMAL LOW (ref 26.0–34.0)
MCHC: 32.6 g/dL (ref 30.0–36.0)
MCV: 78 fL — ABNORMAL LOW (ref 80.0–100.0)
Platelets: 227 10*3/uL (ref 150–400)
RBC: 4.45 MIL/uL (ref 3.87–5.11)
RDW: 18 % — ABNORMAL HIGH (ref 11.5–15.5)
WBC: 5.1 10*3/uL (ref 4.0–10.5)
nRBC: 0 % (ref 0.0–0.2)

## 2022-05-15 LAB — POCT PREGNANCY, URINE: Preg Test, Ur: NEGATIVE

## 2022-05-15 SURGERY — DEBRIDEMENT, WOUND, WITH CLOSURE
Anesthesia: General | Site: Breast | Laterality: Right

## 2022-05-15 MED ORDER — MIDAZOLAM HCL 2 MG/2ML IJ SOLN
INTRAMUSCULAR | Status: AC
  Filled 2022-05-15: qty 2

## 2022-05-15 MED ORDER — ONDANSETRON HCL 4 MG PO TABS: 4.0000 mg | ORAL_TABLET | Freq: Three times a day (TID) | ORAL | Status: DC | PRN

## 2022-05-15 MED ORDER — PHENYLEPHRINE HCL-NACL 20-0.9 MG/250ML-% IV SOLN
INTRAVENOUS | Status: DC | PRN
  Administered 2022-05-15: 50 ug/min via INTRAVENOUS

## 2022-05-15 MED ORDER — DEXAMETHASONE SODIUM PHOSPHATE 4 MG/ML IJ SOLN
INTRAMUSCULAR | Status: DC | PRN
  Administered 2022-05-15: 10 mg via INTRAVENOUS

## 2022-05-15 MED ORDER — ONDANSETRON HCL 4 MG/2ML IJ SOLN
INTRAMUSCULAR | Status: DC | PRN
  Administered 2022-05-15: 4 mg via INTRAVENOUS

## 2022-05-15 MED ORDER — POLYETHYLENE GLYCOL 3350 17 G PO PACK
17.0000 g | PACK | Freq: Every day | ORAL | Status: DC | PRN
  Filled 2022-05-15: qty 1

## 2022-05-15 MED ORDER — OXYCODONE HCL 5 MG PO TABS: 5.0000 mg | ORAL_TABLET | Freq: Three times a day (TID) | ORAL | 0 refills | Status: DC | PRN

## 2022-05-15 MED ORDER — ACETAMINOPHEN 500 MG PO TABS
1000.0000 mg | ORAL_TABLET | Freq: Once | ORAL | Status: AC
  Administered 2022-05-15: 1000 mg via ORAL
  Filled 2022-05-15: qty 2

## 2022-05-15 MED ORDER — LACTATED RINGERS IV SOLN: INTRAVENOUS | Status: DC

## 2022-05-15 MED ORDER — CEFAZOLIN SODIUM-DEXTROSE 1-4 GM/50ML-% IV SOLN
1.0000 g | Freq: Three times a day (TID) | INTRAVENOUS | Status: DC
  Administered 2022-05-16: 1 g via INTRAVENOUS
  Filled 2022-05-15 (×3): qty 50

## 2022-05-15 MED ORDER — CEFAZOLIN SODIUM-DEXTROSE 2-4 GM/100ML-% IV SOLN
2.0000 g | INTRAVENOUS | Status: AC
  Administered 2022-05-15: 2 g via INTRAVENOUS
  Filled 2022-05-15: qty 100

## 2022-05-15 MED ORDER — CHLORHEXIDINE GLUCONATE CLOTH 2 % EX PADS: 6.0000 | MEDICATED_PAD | Freq: Once | CUTANEOUS | Status: DC

## 2022-05-15 MED ORDER — ORAL CARE MOUTH RINSE: 15.0000 mL | Freq: Once | OROMUCOSAL | Status: AC

## 2022-05-15 MED ORDER — CHLORHEXIDINE GLUCONATE 0.12 % MT SOLN
15.0000 mL | Freq: Once | OROMUCOSAL | Status: AC
  Administered 2022-05-15: 15 mL via OROMUCOSAL
  Filled 2022-05-15: qty 15

## 2022-05-15 MED ORDER — LIDOCAINE-EPINEPHRINE 1 %-1:100000 IJ SOLN
INTRAMUSCULAR | Status: AC
  Filled 2022-05-15: qty 1

## 2022-05-15 MED ORDER — BUPIVACAINE-EPINEPHRINE (PF) 0.25% -1:200000 IJ SOLN
INTRAMUSCULAR | Status: AC
  Filled 2022-05-15: qty 30

## 2022-05-15 MED ORDER — MIDAZOLAM HCL 5 MG/5ML IJ SOLN
INTRAMUSCULAR | Status: DC | PRN
  Administered 2022-05-15: 2 mg via INTRAVENOUS

## 2022-05-15 MED ORDER — OXYCODONE HCL 5 MG PO TABS: 5.0000 mg | ORAL_TABLET | ORAL | Status: DC | PRN

## 2022-05-15 MED ORDER — MORPHINE SULFATE (PF) 2 MG/ML IV SOLN
1.0000 mg | INTRAVENOUS | Status: DC | PRN
  Administered 2022-05-16: 1 mg via INTRAVENOUS
  Filled 2022-05-15: qty 1

## 2022-05-15 MED ORDER — PROMETHAZINE HCL 25 MG/ML IJ SOLN: 6.2500 mg | INTRAMUSCULAR | Status: DC | PRN

## 2022-05-15 MED ORDER — CELECOXIB 200 MG PO CAPS
200.0000 mg | ORAL_CAPSULE | Freq: Once | ORAL | Status: AC
  Administered 2022-05-15: 200 mg via ORAL
  Filled 2022-05-15: qty 1

## 2022-05-15 MED ORDER — OXYCODONE HCL 5 MG PO TABS
5.0000 mg | ORAL_TABLET | Freq: Four times a day (QID) | ORAL | Status: DC | PRN
  Administered 2022-05-16: 5 mg via ORAL
  Filled 2022-05-15 (×3): qty 1

## 2022-05-15 MED ORDER — ACETAMINOPHEN 325 MG PO TABS
650.0000 mg | ORAL_TABLET | Freq: Four times a day (QID) | ORAL | Status: DC | PRN
  Administered 2022-05-16 – 2022-05-17 (×3): 650 mg via ORAL
  Filled 2022-05-15 (×3): qty 2

## 2022-05-15 MED ORDER — LIDOCAINE HCL (CARDIAC) PF 50 MG/5ML IV SOSY
PREFILLED_SYRINGE | INTRAVENOUS | Status: DC | PRN
  Administered 2022-05-15: 100 mg via INTRAVENOUS

## 2022-05-15 MED ORDER — FENTANYL CITRATE (PF) 100 MCG/2ML IJ SOLN
INTRAMUSCULAR | Status: DC | PRN
  Administered 2022-05-15: 25 ug via INTRAVENOUS
  Administered 2022-05-15: 50 ug via INTRAVENOUS
  Administered 2022-05-15: 25 ug via INTRAVENOUS
  Administered 2022-05-15 (×3): 50 ug via INTRAVENOUS

## 2022-05-15 MED ORDER — ACETAMINOPHEN 650 MG RE SUPP: 650.0000 mg | Freq: Four times a day (QID) | RECTAL | Status: DC | PRN

## 2022-05-15 MED ORDER — 0.9 % SODIUM CHLORIDE (POUR BTL) OPTIME
TOPICAL | Status: DC | PRN
  Administered 2022-05-15: 1000 mL

## 2022-05-15 MED ORDER — FENTANYL CITRATE (PF) 100 MCG/2ML IJ SOLN
25.0000 ug | INTRAMUSCULAR | Status: DC | PRN
  Administered 2022-05-15: 25 ug via INTRAVENOUS

## 2022-05-15 MED ORDER — PHENYLEPHRINE HCL (PRESSORS) 10 MG/ML IV SOLN
INTRAVENOUS | Status: DC | PRN
  Administered 2022-05-15 (×3): 80 ug via INTRAVENOUS

## 2022-05-15 MED ORDER — PROPOFOL 10 MG/ML IV BOLUS
INTRAVENOUS | Status: AC
  Filled 2022-05-15: qty 20

## 2022-05-15 MED ORDER — FENTANYL CITRATE (PF) 100 MCG/2ML IJ SOLN
INTRAMUSCULAR | Status: AC
  Filled 2022-05-15: qty 2

## 2022-05-15 MED ORDER — DOXYCYCLINE HYCLATE 100 MG PO TABS: 100.0000 mg | ORAL_TABLET | Freq: Two times a day (BID) | ORAL | 0 refills | Status: DC

## 2022-05-15 MED ORDER — PROPOFOL 10 MG/ML IV BOLUS
INTRAVENOUS | Status: DC | PRN
  Administered 2022-05-15: 170 mg via INTRAVENOUS

## 2022-05-15 MED ORDER — SCOPOLAMINE 1 MG/3DAYS TD PT72
1.0000 | MEDICATED_PATCH | TRANSDERMAL | Status: DC
  Administered 2022-05-15: 1.5 mg via TRANSDERMAL
  Filled 2022-05-15: qty 1

## 2022-05-15 MED ORDER — AMISULPRIDE (ANTIEMETIC) 5 MG/2ML IV SOLN: 10.0000 mg | Freq: Once | INTRAVENOUS | Status: DC | PRN

## 2022-05-15 MED ORDER — FENTANYL CITRATE (PF) 250 MCG/5ML IJ SOLN
INTRAMUSCULAR | Status: AC
  Filled 2022-05-15: qty 5

## 2022-05-15 SURGICAL SUPPLY — 24 items
BLADE HEX COATED 2.75 (ELECTRODE) ×1 IMPLANT
BLADE SURG 15 STRL LF DISP TIS (BLADE) ×1 IMPLANT
BLADE SURG 15 STRL SS (BLADE) ×1
CANISTER WOUNDNEG PRESSURE 500 (CANNISTER) IMPLANT
COVER BACK TABLE 60X90IN (DRAPES) ×1 IMPLANT
COVER MAYO STAND STRL (DRAPES) ×1 IMPLANT
DRSG VAC ATS MED SENSATRAC (GAUZE/BANDAGES/DRESSINGS) IMPLANT
ELECT REM PT RETURN 9FT ADLT (ELECTROSURGICAL) ×1
ELECTRODE REM PT RTRN 9FT ADLT (ELECTROSURGICAL) ×1 IMPLANT
GLOVE BIO SURGEON STRL SZ 6.5 (GLOVE) ×2 IMPLANT
GOWN STRL REUS W/ TWL LRG LVL3 (GOWN DISPOSABLE) ×2 IMPLANT
GOWN STRL REUS W/TWL LRG LVL3 (GOWN DISPOSABLE) ×2
KIT BASIN (CUSTOM PROCEDURE TRAY) ×1 IMPLANT
NDL HYPO 25X1 1.5 SAFETY (NEEDLE) ×1 IMPLANT
NEEDLE HYPO 25X1 1.5 SAFETY (NEEDLE) ×1 IMPLANT
NS IRRIG 1000ML POUR BTL (IV SOLUTION) ×1 IMPLANT
PACK GENERAL/GYN (CUSTOM PROCEDURE TRAY) ×1 IMPLANT
PENCIL SMOKE EVACUATOR (MISCELLANEOUS) ×1 IMPLANT
SPONGE T-LAP 18X18 ~~LOC~~+RFID (SPONGE) ×1 IMPLANT
SYR CONTROL 10ML LL (SYRINGE) ×1 IMPLANT
TOWEL GREEN STERILE FF (TOWEL DISPOSABLE) ×2 IMPLANT
TUBE CONNECTING 20X1/4 (TUBING) ×1 IMPLANT
UNDERPAD 30X36 HEAVY ABSORB (UNDERPADS AND DIAPERS) ×1 IMPLANT
YANKAUER SUCT BULB TIP NO VENT (SUCTIONS) ×1 IMPLANT

## 2022-05-15 NOTE — Anesthesia Procedure Notes (Signed)
Procedure Name: LMA Insertion Date/Time: 05/15/2022 8:19 PM  Performed by: Suzy Bouchard, CRNAPre-anesthesia Checklist: Patient identified, Emergency Drugs available, Patient being monitored, Timeout performed and Suction available Patient Re-evaluated:Patient Re-evaluated prior to induction Oxygen Delivery Method: Circle system utilized Preoxygenation: Pre-oxygenation with 100% oxygen Induction Type: IV induction Ventilation: Mask ventilation without difficulty LMA: LMA inserted LMA Size: 4.0 Number of attempts: 1 Placement Confirmation: positive ETCO2 and breath sounds checked- equal and bilateral Tube secured with: Tape Dental Injury: Teeth and Oropharynx as per pre-operative assessment

## 2022-05-15 NOTE — Progress Notes (Signed)
   Referring Provider Rusty Aus, MD [place],  [place] [address]   CC:  Chief Complaint  Patient presents with   Post-op Follow-up      Kim Fischer is an 51 y.o. female.  HPI: I was asked to see Kim Fischer who returns today exactly 1 month out from a bilateral breast reduction.  She has frank necrosis of the nipple and areolar complex as well as a portion of the pedicle.  No Known Allergies  Outpatient Encounter Medications as of 05/15/2022  Medication Sig   ADVAIR DISKUS 100-50 MCG/ACT AEPB SMARTSIG:1 inhalation Via Inhaler Every 12 Hours   ALPRAZolam (XANAX) 1 MG tablet Take 1 mg by mouth at bedtime.   collagenase (SANTYL) 250 UNIT/GM ointment Apply 1 Application topically daily.   ferrous gluconate (FERGON) 324 MG tablet Take 324 mg by mouth every morning.   levothyroxine (SYNTHROID) 100 MCG tablet Take by mouth.   ondansetron (ZOFRAN-ODT) 4 MG disintegrating tablet Take 1 tablet (4 mg total) by mouth every 8 (eight) hours as needed for nausea or vomiting.   pantoprazole (PROTONIX) 40 MG tablet Take 40 mg by mouth daily.   pramipexole (MIRAPEX) 0.25 MG tablet TAKE 2 TABLETS (0.5 MG TOTAL) BY MOUTH NIGHTLY   venlafaxine XR (EFFEXOR-XR) 75 MG 24 hr capsule Take 75 mg by mouth daily.   No facility-administered encounter medications on file as of 05/15/2022.     No past medical history on file.  Past Surgical History:  Procedure Laterality Date   BREAST BIOPSY Right 2010   negative   BREAST BIOPSY Left 2016   NEG   BREAST EXCISIONAL BIOPSY Right 1997   negative   BREAST REDUCTION SURGERY Bilateral 04/14/2022   Procedure: BILATERAL MAMMARY REDUCTION  (BREAST);  Surgeon: [name], MD;  Location: [facility];  Service: Plastics;  Laterality: Bilateral;    Family History  Problem Relation Age of Onset   Breast cancer Mother 46       and again at 69    Social History   Social History  Narrative   Not on file     Review of Systems General: Denies fevers, chills, weight loss CV: Denies chest pain, shortness of breath, palpitations Breast: Patient notes increasing pain and smell from her right breast.  Physical Exam    05/15/2022   11:37 AM 05/11/2022    2:19 PM 04/14/2022   12:00 PM  Vitals with BMI  Systolic [phone]  Diastolic 83 91 80  Pulse 96 90 96    General:  No acute distress,  Alert and oriented, Non-Toxic, Normal speech and affect Breast: Right breast has frank necrosis of the nipple and areola complex Rodick tissue down into the breast along the pedicle.  There is erythema along the inframammary incision.  Mammogram: Not applicable Assessment/Plan Right breast, tissue necrosis: I debrided the frankly necrotic tissue in the clinic removing the nipple area areolar complex and the necrotic tissue is deep into the breast as I could.  She still has necrotic tissue that I could not adequately debride.  I will take her to the operating room this afternoon and open up the previous incisions and debride the tissue and wash the wound out.  She understands all of this and consents and request that I proceed with surgery.  [name] 05/15/2022, 12:56 PM

## 2022-05-15 NOTE — H&P (Signed)
Ms Kim Fischer is in the pre op holding area. The surgical site was marked.   No change in physical exam or indication for the procedure.  All questions answered to her satisfaction.  Will proceed with right breast debridement at her request.

## 2022-05-15 NOTE — Progress Notes (Signed)
Patient ID: Kim Fischer, female    DOB: 07-Feb-1971, 51 y.o.   MRN: [id number]  Chief Complaint  Patient presents with   Pre-op Exam      ICD-10-CM   1. Postoperative dehiscence of skin wound, initial encounter  T81.31XA        History of Present Illness: Kim Fischer is a 51 y.o.  female  with a history of right nipple areolar necrosis.  She presents for preoperative evaluation for upcoming procedure, right breast debridement, possible local tissue rearrangement, scheduled for today with Dr. Ladona Ridgel.  The patient has not had problems with anesthesia.  She denies any history of cardiac disease.  She denies taking any blood thinners.  Patient reports she is not a smoker.  Patient denies taking any birth control or hormone replacement.  She denies any history of miscarriages.  She denies any personal or family history of blood clots or clotting diseases.  She reports her last surgery was exactly 1 month ago.  She denies any other recent traumas, surgeries, infections, strokes or heart attacks.  She reports history of exercise-induced asthma.  She denies any history of varicose veins or swollen legs.  Summary of Previous Visit: Patient was seen earlier in the clinic today with concern for infection.  On exam, she had frank necrosis to the right nipple and areolar complex.  The frankly necrotic tissue was debrided in the clinic which remove the NAC and the necrotic tissue deep into the breast.  She also was noted to have some erythema noted along the inframammary incision.  Patient was seen and examined by Dr. Ladona Ridgel.  Plan was for patient to go to the OR later this afternoon for debridement.  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  I discussed with the patient that she may return to her office duties whenever she feels comfortable after surgery.  PMH Significant for: Macromastia, hypothyroidism, exercise-induced asthma   Past  Medical History: Allergies: No Known Allergies  Current Medications: No current facility-administered medications for this visit.  Current Outpatient Medications:    doxycycline (VIBRA-TABS) 100 MG tablet, Take 1 tablet (100 mg total) by mouth 2 (two) times daily for 7 days., Disp: 14 tablet, Rfl: 0   oxyCODONE (ROXICODONE) 5 MG immediate release tablet, Take 1 tablet (5 mg total) by mouth every 8 (eight) hours as needed for up to 10 doses for severe pain., Disp: 10 tablet, Rfl: 0  Facility-Administered Medications Ordered in Other Visits:    chlorhexidine (PERIDEX) 0.12 % solution 15 mL, 15 mL, Mouth/Throat, Once **OR** Oral care mouth rinse, 15 mL, Mouth Rinse, Once, Dorris Singh, MD   lactated ringers infusion, , Intravenous, Continuous, Dorris Singh, MD  Past Medical Problems: Past Medical History:  Diagnosis Date   Anemia    Depression     Past Surgical History: Past Surgical History:  Procedure Laterality Date   BREAST BIOPSY Right 2010   negative   BREAST BIOPSY Left 2016   NEG   BREAST EXCISIONAL BIOPSY Right 1997   negative   BREAST REDUCTION SURGERY Bilateral 04/14/2022   Procedure: BILATERAL MAMMARY REDUCTION  (BREAST);  Surgeon: Janne Napoleon, MD;  Location: [facility];  Service: Plastics;  Laterality: Bilateral;   CHOLECYSTECTOMY      Social History: Social History   Socioeconomic History   Marital status: Married    Spouse name: Not on file   Number  of children: Not on file   Years of education: Not on file   Highest education level: Not on file  Occupational History   Not on file  Tobacco Use   Smoking status: Never   Smokeless tobacco: Never  Vaping Use   Vaping Use: Never used  Substance and Sexual Activity   Alcohol use: Not Currently   Drug use: Never   Sexual activity: Not on file  Other Topics Concern   Not on file  Social History Narrative   Not on file   Social Determinants of Health   Financial Resource  Strain: Not on file  Food Insecurity: Not on file  Transportation Needs: Not on file  Physical Activity: Not on file  Stress: Not on file  Social Connections: Not on file  Intimate Partner Violence: Not on file    Family History: Family History  Problem Relation Age of Onset   Breast cancer Mother 96       and again at 16    Review of Systems: Patient reports she has had a low-grade fever.  She denies any nausea or vomiting.  She denies any chills.  Physical Exam: Vital Signs LMP 04/16/2022 (Approximate)   Physical Exam  Constitutional:      General: Not in acute distress.    Appearance: Normal appearance. Not ill-appearing.  HENT:     Head: Normocephalic and atraumatic.  Neck:     Musculoskeletal: Normal range of motion.  Cardiovascular:     Rate and Rhythm: Normal rate Pulmonary:     Effort: Pulmonary effort is normal. No respiratory distress.  Breasts: Nipple areolar necrosis noted to the right side within 8 cm x 5 cm wound.  There is erythema noted along the inframammary incision. Musculoskeletal: Normal range of motion.  Skin:    General: Skin is warm and dry.     Findings: No erythema or rash.  Neurological:     Mental Status: Alert and oriented to person, place, and time. Mental status is at baseline.  Psychiatric:        Mood and Affect: Mood normal.        Behavior: Behavior normal.    Assessment/Plan: The patient is scheduled for right breast debridement, possible local tissue rearrangement with Dr. Ladona Ridgel.  Risks, benefits, and alternatives of procedure discussed, questions answered and consent obtained.    Smoking Status: Non-smoker; Counseling Given?  N/A  Caprini Score: 4; Risk Factors include: Age, BMI > 25, and length of planned surgery. Recommendation for mechanical prophylaxis. Encourage early ambulation.   Pictures obtained: Today  Post-op Rx sent to pharmacy:  Doxycycline, oxycodone  Patient reports she still has some Zofran leftover from  her previous surgery.  I discussed with the patient that she can take this as needed for nausea or vomiting.  Patient was provided with the General Surgical Risk consent document and Pain Medication Agreement prior to their appointment.  They had adequate time to read through the risk consent documents and Pain Medication Agreement. We also discussed them in person together during this preop appointment. All of their questions were answered to their satisfaction.  Recommended calling if they have any further questions.  Risk consent form and Pain Medication Agreement to be scanned into patient's chart.  The consent was obtained with risks and complications reviewed which included bleeding, pain, scar, infection and the risk of anesthesia.  The patients questions were answered to the patients expressed satisfaction.    Electronically signed by: Laurena Spies,  PA-C 05/15/2022 3:46 PM

## 2022-05-15 NOTE — Op Note (Addendum)
DATE OF OPERATION: 05/15/2022  LOCATION: [facility] Main operating Room  PREOPERATIVE DIAGNOSIS: Ischemic and necrotic tissue of the right breast  POSTOPERATIVE DIAGNOSIS: Same  PROCEDURE: Debridement of necrotic tissue right breast  SURGEON: Jeanann Lewandowsky  ASSISTANT: Blaine Hamper  EBL: 100 mL cc  CONDITION: Stable  COMPLICATIONS: None  INDICATION: The patient, Kim Fischer, is a 51 y.o. female born on 27-Nov-1970, is here for treatment of necrotic tissue in the right breast.  The patient underwent a breast reduction approximately 1 month ago since that time she has had difficulty with ischemia of the nipple and areolar complex.  She presented to clinic today for evaluation where she had frankly necrotic tissue in the lateral aspect of the right breast.  Limited debridement was performed in the clinic however it was clear that would not be able to remove all of the necrotic tissue and arrangements were made for port to have her debridement in the operating room.  I discussed the procedure at length withMrs Sheperd and her husband as well as the risks of bleeding, infection and the need for additional surgeries.  PROCEDURE DETAILS:  The patient was seen prior to surgery and marked.  The IV antibiotics were given. The patient was taken to the operating room and given a general anesthetic. A standard time out was performed and all information was confirmed by those in the room. SCDs were placed.   The vertical and lateral aspects of the inframammary fold incisions were opened sharply.  There was approximately 100 g ( two areas, each 10 by 5 cm) of additional necrotic tissue extending down the lateral wall of the right breast and extending to the chest wall.  This was debrided with sharp dissection and electrocautery.  Once I had removed all of the obvious necrotic tissue the wound was irrigated with 2 L of normal saline.  Hemostasis was achieved using electrocautery.  I elected to place a wound VAC for  wound care.  The sponge was cut to the appropriate size and placed in the wound the sponge was covered with the adherent dressings.  The wound VAC was placed to 125 mm of suction. The patient was allowed to wake up and was taken to the recovery room in stable condition at the end of the case. The family was notified at the end of the case.  She will be admitted to the hospital until we can make arrangements for a home wound VAC.  She will most likely require a second surgery for additional debridement but this decision will be made next week.  The advanced practice practitioner (APP) assisted throughout the case.  The APP was essential in retraction and counter traction when needed to make the case progress smoothly.  This retraction and assistance made it possible to see the tissue plans for the procedure.  The assistance was needed for blood control, tissue re-approximation and assisted with closure of the incision site.

## 2022-05-15 NOTE — Anesthesia Preprocedure Evaluation (Addendum)
Anesthesia Evaluation  Patient identified by MRN, date of birth, ID band Patient awake    Reviewed: Allergy & Precautions, NPO status , Patient's Chart, lab work & pertinent test results  Airway Mallampati: II       Dental no notable dental hx.    Pulmonary neg pulmonary ROS   Pulmonary exam normal        Cardiovascular negative cardio ROS Normal cardiovascular exam     Neuro/Psych  PSYCHIATRIC DISORDERS Anxiety Depression    negative neurological ROS     GI/Hepatic Neg liver ROS,GERD  ,,  Endo/Other  Hypothyroidism    Renal/GU negative Renal ROS     Musculoskeletal negative musculoskeletal ROS (+)    Abdominal  (+) + obese  Peds  Hematology  (+) Blood dyscrasia, anemia   Anesthesia Other Findings   Reproductive/Obstetrics negative OB ROS                             Anesthesia Physical Anesthesia Plan  ASA: 2  Anesthesia Plan: General   Post-op Pain Management: Tylenol PO (pre-op)* and Celebrex PO (pre-op)*   Induction: Intravenous  PONV Risk Score and Plan: 4 or greater and Ondansetron, Dexamethasone, Midazolam and Scopolamine patch - Pre-op  Airway Management Planned: LMA  Additional Equipment: None  Intra-op Plan:   Post-operative Plan: Extubation in OR  Informed Consent: I have reviewed the patients History and Physical, chart, labs and discussed the procedure including the risks, benefits and alternatives for the proposed anesthesia with the patient or authorized representative who has indicated his/her understanding and acceptance.     Dental advisory given  Plan Discussed with: Anesthesiologist and CRNA  Anesthesia Plan Comments:         Anesthesia Quick Evaluation

## 2022-05-15 NOTE — Transfer of Care (Signed)
Immediate Anesthesia Transfer of Care Note  Patient: Kim Fischer  Procedure(s) Performed: Right breast debridement, possible local tissue rearrangement (Right: Breast)  Patient Location: PACU  Anesthesia Type:General  Level of Consciousness: awake, alert , and oriented  Airway & Oxygen Therapy: Patient connected to nasal cannula oxygen  Post-op Assessment: Report given to RN and Post -op Vital signs reviewed and stable  Post vital signs: Reviewed and stable  Last Vitals:  Vitals Value Taken Time  BP 126/78 05/15/22 2145  Temp    Pulse 87 05/15/22 2146  Resp 18 05/15/22 2146  SpO2 93 % 05/15/22 2146  Vitals shown include unvalidated device data.  Last Pain:  Vitals:   05/15/22 1543  TempSrc: Oral  PainSc: 3       Patients Stated Pain Goal: 0 (80/16/55 3748)  Complications: No notable events documented.

## 2022-05-16 ENCOUNTER — Encounter (HOSPITAL_COMMUNITY): Payer: Self-pay | Admitting: Plastic Surgery

## 2022-05-16 LAB — HIV ANTIBODY (ROUTINE TESTING W REFLEX): HIV Screen 4th Generation wRfx: NONREACTIVE

## 2022-05-16 MED ORDER — CEPHALEXIN 500 MG PO CAPS
500.0000 mg | ORAL_CAPSULE | Freq: Two times a day (BID) | ORAL | Status: DC
  Administered 2022-05-16 – 2022-05-18 (×5): 500 mg via ORAL
  Filled 2022-05-16 (×5): qty 1

## 2022-05-16 NOTE — TOC Initial Note (Signed)
Transition of Care [facility]) - Initial/Assessment Note    Patient Details  Name: Kim Fischer MRN: [id number] Date of Birth: 08-05-1970  [facility] CM/SW Contact:    Bethena Roys, RN Phone Number: 05/16/2022, 11:38 AM  Clinical Narrative:  Case Manager received consult for wound vac for home. Plan was for patient to transition home today; however, insurance will need to approve the wound vac before transition home. Case Manager spoke with Dr. Glennon Mac; orders signed for DME wound vac and submitted to [id number]- awaiting insurance approval. Per Dr. Glennon Mac, the patient will be seen in the office for vac changes and will only need for 2 weeks-no HH needed. Case Manager initially called [facility]; however, noted patient will be seen in the office for changes. [facility] cancelled. Monday 05-18-22 patient will be planned for OR for additional debridement. Case Manger will continue to follow for additional transition of care needs as the patient progresses.         Expected Discharge Plan: Home/Self Care Barriers to Discharge: Other (must enter comment) (Awaiting insurance approval for wound vac delivery.)   Patient Goals and CMS Choice Patient states their goals for this hospitalization and ongoing recovery are:: to return home   Choice offered to / list presented to : NA  Expected Discharge Plan and Services Expected Discharge Plan: Home/Self Care In-house Referral: NA Discharge Planning Services: CM Consult Post Acute Care Choice: Durable Medical Equipment Living arrangements for the past 2 months: Single Family Home                 DME Arranged: Negative pressure wound device DME Agency: [employer] Date DME Agency Contacted: 05/16/22 Time DME Agency Contacted: 1137 Representative spoke with at DME Agency: Olivia Mackie HH Arranged: NA (patient will have vac changes in the office.)          Prior Living Arrangements/Services Living arrangements for the past 2 months:  Single Family Home Lives with:: Relatives Patient language and need for interpreter reviewed:: Yes Do you feel safe going back to the place where you live?: Yes      Need for Family Participation in Patient Care: Yes (Comment) Care giver support system in place?: Yes (comment)   Criminal Activity/Legal Involvement Pertinent to Current Situation/Hospitalization: No - Comment as needed  Activities of Daily Living Home Assistive Devices/Equipment: Eyeglasses ADL Screening (condition at time of admission) Patient's cognitive ability adequate to safely complete daily activities?: Yes Is the patient deaf or have difficulty hearing?: No Does the patient have difficulty seeing, even when wearing glasses/contacts?: No Does the patient have difficulty concentrating, remembering, or making decisions?: No Patient able to express need for assistance with ADLs?: Yes Does the patient have difficulty dressing or bathing?: No Independently performs ADLs?: Yes (appropriate for developmental age) Does the patient have difficulty walking or climbing stairs?: No Weakness of Legs: None Weakness of Arms/Hands: None  Permission Sought/Granted Permission sought to share information with : Family Supports, Customer service manager, Case Optician, dispensing granted to share information with : Yes, Verbal Permission Granted     Permission granted to share info w AGENCY: [email or web address]        Emotional Assessment Appearance:: Appears stated age Attitude/Demeanor/Rapport: Engaged Affect (typically observed): Appropriate Orientation: : Oriented to Situation, Oriented to  Time, Oriented to Place, Oriented to Self Alcohol / Substance Use: Not Applicable Psych Involvement: No (comment)  Admission diagnosis:  Wound dehiscence [T81.30XA] Wound of right breast [S21.001A] Patient Active Problem List   Diagnosis Date Noted  Wound dehiscence 05/15/2022   Wound of right breast 05/15/2022   PCP:  Danella Penton,  MD Pharmacy:   [facility] [phone] - [place], [place] - [address] [place] [address] Phone: [phone] Fax: [phone]  Readmission Risk Interventions     No data to display

## 2022-05-16 NOTE — Progress Notes (Signed)
1 Day Post-Op  Subjective: Kim Fischer is postoperative day 1 after debridement of right breast secondary to necrosis post breast reduction.  The nipple and lateral aspect of the chest wall have both been debrided and a wound VAC was placed last night.  She is doing well this morning pain is adequately controlled she is ambulating tolerating p.o. with no difficulty.  I placed a medium sponge trimmed to approximately 12 x 10 cm.  The wound was 3 dimensionally 10 to 15 cm deep from the skin to the chest wall 10 cm wide and 5 cm wide.  Objective: Vital signs in last 24 hours: Temp:  [97.7 F (36.5 C)-98.6 F (37 C)] 97.8 F (36.6 C) (11/04 0732) Pulse Rate:  [65-96] 72 (11/04 0732) Resp:  [11-20] 16 (11/04 0732) BP: (103-137)/(56-85) 116/64 (11/04 0732) SpO2:  [92 %-98 %] 97 % (11/04 0732) Weight:  [90.7 kg] 90.7 kg (11/03 1543)    Intake/Output from previous day: 11/03 0701 - 11/04 0700 In: 1440 [P.O.:240; I.V.:1200] Out: 25 [Blood:25] Intake/Output this shift: No intake/output data recorded.  Skin: Skin color, texture, turgor normal. No rashes or lesions or The skin surrounding the wound is much less erythematous this morning.  The wound VAC is holding well with no obvious leaks.  Lab Results:  @LABLAST2 (wbc:2,hgb:2,hct:2,plt:2) BMET No results for input(s): "NA", "K", "CL", "CO2", "GLUCOSE", "BUN", "CREATININE", "CALCIUM" in the last 72 hours. PT/INR No results for input(s): "LABPROT", "INR" in the last 72 hours. ABG No results for input(s): "PHART", "HCO3" in the last 72 hours.  Invalid input(s): "PCO2", "PO2"  Studies/Results: No results found.  Anti-infectives: Anti-infectives (From admission, onward)    Start     Dose/Rate Route Frequency Ordered Stop   05/16/22 0600  ceFAZolin (ANCEF) IVPB 2g/100 mL premix        2 g 200 mL/hr over 30 Minutes Intravenous On call to O.R. 05/15/22 1557 05/15/22 2023   05/16/22 0400  ceFAZolin (ANCEF) IVPB 1 g/50 mL premix        1  g 100 mL/hr over 30 Minutes Intravenous Every 8 hours 05/15/22 2149 05/16/22 2159       Assessment/Plan: s/p Procedure(s): Debridement of necrotic tissue right breast APPLICATION OF WOUND VAC RIGHT BREAST Will keep patient in the hospital until we can get a wound VAC for her to take home.  Realistically this will be Monday before the wound VAC arrives. She is now scheduled for the operating room for for additional debridement of the right breast and for wound VAC replacement.  She may ambulate as tolerated she may leave the floor if approved by the nursing staff and she may have a diet of choice.  LOS: 1 day    [name] 05/16/2022

## 2022-05-16 NOTE — Anesthesia Postprocedure Evaluation (Signed)
Anesthesia Post Note  Patient: Kim Fischer  Procedure(s) Performed: Debridement of necrotic tissue right breast (Right: Breast) APPLICATION OF WOUND VAC RIGHT BREAST (Right: Breast)     Patient location during evaluation: PACU Anesthesia Type: General Level of consciousness: awake Pain management: pain level controlled Vital Signs Assessment: post-procedure vital signs reviewed and stable Respiratory status: spontaneous breathing Cardiovascular status: stable Postop Assessment: no apparent nausea or vomiting Anesthetic complications: no   No notable events documented.  Last Vitals:  Vitals:   05/15/22 2242 05/16/22 0025  BP: 117/76 117/67  Pulse: 77 81  Resp: 17   Temp: 36.5 C 36.7 C  SpO2: 94% 95%    Last Pain:  Vitals:   05/16/22 0325  TempSrc:   PainSc: 0-No pain                 Lorrin Bodner

## 2022-05-17 DIAGNOSIS — Z79899 Other long term (current) drug therapy: Secondary | ICD-10-CM | POA: Diagnosis not present

## 2022-05-17 DIAGNOSIS — Z6836 Body mass index (BMI) 36.0-36.9, adult: Secondary | ICD-10-CM | POA: Diagnosis not present

## 2022-05-17 DIAGNOSIS — E669 Obesity, unspecified: Secondary | ICD-10-CM | POA: Diagnosis present

## 2022-05-17 DIAGNOSIS — Y848 Other medical procedures as the cause of abnormal reaction of the patient, or of later complication, without mention of misadventure at the time of the procedure: Secondary | ICD-10-CM | POA: Diagnosis present

## 2022-05-17 DIAGNOSIS — T8131XA Disruption of external operation (surgical) wound, not elsewhere classified, initial encounter: Secondary | ICD-10-CM | POA: Diagnosis present

## 2022-05-17 DIAGNOSIS — Z79890 Hormone replacement therapy (postmenopausal): Secondary | ICD-10-CM | POA: Diagnosis not present

## 2022-05-17 DIAGNOSIS — Z7984 Long term (current) use of oral hypoglycemic drugs: Secondary | ICD-10-CM | POA: Diagnosis not present

## 2022-05-17 DIAGNOSIS — E039 Hypothyroidism, unspecified: Secondary | ICD-10-CM | POA: Diagnosis present

## 2022-05-17 DIAGNOSIS — I96 Gangrene, not elsewhere classified: Secondary | ICD-10-CM | POA: Diagnosis present

## 2022-05-17 DIAGNOSIS — T8130XA Disruption of wound, unspecified, initial encounter: Secondary | ICD-10-CM | POA: Diagnosis present

## 2022-05-17 DIAGNOSIS — L7682 Other postprocedural complications of skin and subcutaneous tissue: Secondary | ICD-10-CM | POA: Diagnosis not present

## 2022-05-17 MED ORDER — PRAMIPEXOLE DIHYDROCHLORIDE 0.25 MG PO TABS
0.5000 mg | ORAL_TABLET | Freq: Every day | ORAL | Status: DC
  Administered 2022-05-17: 0.5 mg via ORAL
  Filled 2022-05-17 (×2): qty 2

## 2022-05-17 MED ORDER — PANTOPRAZOLE SODIUM 40 MG PO TBEC
40.0000 mg | DELAYED_RELEASE_TABLET | Freq: Every day | ORAL | Status: DC
  Administered 2022-05-17 – 2022-05-18 (×2): 40 mg via ORAL
  Filled 2022-05-17 (×2): qty 1

## 2022-05-17 MED ORDER — VENLAFAXINE HCL ER 75 MG PO CP24
75.0000 mg | ORAL_CAPSULE | Freq: Every day | ORAL | Status: DC
  Administered 2022-05-17 – 2022-05-18 (×2): 75 mg via ORAL
  Filled 2022-05-17 (×2): qty 1

## 2022-05-17 MED ORDER — METFORMIN HCL 500 MG PO TABS
500.0000 mg | ORAL_TABLET | Freq: Two times a day (BID) | ORAL | Status: DC
  Administered 2022-05-17 – 2022-05-18 (×2): 500 mg via ORAL
  Filled 2022-05-17 (×2): qty 1

## 2022-05-17 MED ORDER — ALPRAZOLAM 0.25 MG PO TABS
1.0000 mg | ORAL_TABLET | Freq: Every day | ORAL | Status: DC
  Administered 2022-05-17: 1 mg via ORAL
  Filled 2022-05-17: qty 4

## 2022-05-17 NOTE — Progress Notes (Signed)
2 Days Post-Op  Subjective: Doing well, no specific complaints. Ambulated and tolerated diet without difficulty. Did have an issue with the wound vac last night but the nursing staff resolved the problem.  Objective: Vital signs in last 24 hours: Temp:  [97.7 F (36.5 C)-98 F (36.7 C)] 97.7 F (36.5 C) (11/05 0444) Pulse Rate:  [66-75] 66 (11/05 0444) Resp:  [16-18] 18 (11/05 0444) BP: (113-122)/(61-77) 122/77 (11/05 0444) SpO2:  [98 %-99 %] 99 % (11/05 0444) Last BM Date : 05/14/22  Intake/Output from previous day: 11/04 0701 - 11/05 0700 In: -  Out: 100 [Drains:100] Intake/Output this shift: No intake/output data recorded.  The wound vac is in place and holding suction. The erythema on the breast, especially the inframammary fold , has decreased significantly.  Lab Results:  @LABLAST2 (wbc:2,hgb:2,hct:2,plt:2) BMET No results for input(s): "NA", "K", "CL", "CO2", "GLUCOSE", "BUN", "CREATININE", "CALCIUM" in the last 72 hours. PT/INR No results for input(s): "LABPROT", "INR" in the last 72 hours. ABG No results for input(s): "PHART", "HCO3" in the last 72 hours.  Invalid input(s): "PCO2", "PO2"  Studies/Results: No results found.  Anti-infectives: Anti-infectives (From admission, onward)    Start     Dose/Rate Route Frequency Ordered Stop   05/16/22 1215  cephALEXin (KEFLEX) capsule 500 mg        500 mg Oral Every 12 hours 05/16/22 1119     05/16/22 0600  ceFAZolin (ANCEF) IVPB 2g/100 mL premix        2 g 200 mL/hr over 30 Minutes Intravenous On call to O.R. 05/15/22 1557 05/15/22 2023   05/16/22 0400  ceFAZolin (ANCEF) IVPB 1 g/50 mL premix  Status:  Discontinued        1 g 100 mL/hr over 30 Minutes Intravenous Every 8 hours 05/15/22 2149 05/16/22 1119       Assessment/Plan: s/p Procedure(s): Debridement of necrotic tissue right breast APPLICATION OF WOUND VAC RIGHT BREAST Doing well. Will return to the operating room tomorrow at 1300 for wound vac change  and additional debridement as required. Will discharge home if home wound vac is available. Follow up exam is already arranged for Friday November 10th in the plastic surgery clinic.  Home meds reordered.  LOS: 1 day    [name] 05/17/2022

## 2022-05-18 ENCOUNTER — Inpatient Hospital Stay (HOSPITAL_COMMUNITY): Payer: 59 | Admitting: Anesthesiology

## 2022-05-18 ENCOUNTER — Other Ambulatory Visit: Payer: Self-pay

## 2022-05-18 ENCOUNTER — Encounter: Payer: 59 | Admitting: Physician Assistant

## 2022-05-18 ENCOUNTER — Encounter (HOSPITAL_COMMUNITY)
Admission: AD | Disposition: A | Discharge status: Home / Self Care | Payer: Self-pay | Source: Ambulatory Visit | Attending: Plastic Surgery

## 2022-05-18 DIAGNOSIS — T8131XA Disruption of external operation (surgical) wound, not elsewhere classified, initial encounter: Principal | ICD-10-CM

## 2022-05-18 DIAGNOSIS — L7682 Other postprocedural complications of skin and subcutaneous tissue: Secondary | ICD-10-CM | POA: Diagnosis not present

## 2022-05-18 HISTORY — PX: INCISION AND DRAINAGE OF WOUND: SHX1803

## 2022-05-18 LAB — SURGICAL PCR SCREEN
MRSA, PCR: NEGATIVE
Staphylococcus aureus: NEGATIVE

## 2022-05-18 SURGERY — IRRIGATION AND DEBRIDEMENT WOUND
Anesthesia: General | Laterality: Right

## 2022-05-18 MED ORDER — ONDANSETRON HCL 4 MG/2ML IJ SOLN: 4.0000 mg | Freq: Four times a day (QID) | INTRAMUSCULAR | Status: DC | PRN

## 2022-05-18 MED ORDER — ONDANSETRON HCL 2 MG/ML IV SOLN: 4.0000 mg | Freq: Four times a day (QID) | INTRAVENOUS | Status: DC | PRN

## 2022-05-18 MED ORDER — LACTATED RINGERS IV SOLN: INTRAVENOUS | Status: DC

## 2022-05-18 MED ORDER — PHENYLEPHRINE 80 MCG/ML (10ML) SYRINGE FOR IV PUSH (FOR BLOOD PRESSURE SUPPORT)
PREFILLED_SYRINGE | INTRAVENOUS | Status: DC | PRN
  Administered 2022-05-18 (×3): 80 ug via INTRAVENOUS
  Administered 2022-05-18: 160 ug via INTRAVENOUS
  Administered 2022-05-18 (×4): 80 ug via INTRAVENOUS

## 2022-05-18 MED ORDER — ONDANSETRON HCL 4 MG/2ML IJ SOLN: 4.0000 mg | Freq: Once | INTRAMUSCULAR | Status: DC | PRN

## 2022-05-18 MED ORDER — PROPOFOL 10 MG/ML IV BOLUS
INTRAVENOUS | Status: DC | PRN
  Administered 2022-05-18: 200 mg via INTRAVENOUS

## 2022-05-18 MED ORDER — OXYCODONE HCL 5 MG/5ML PO SOLN: 5.0000 mg | Freq: Once | ORAL | Status: DC | PRN

## 2022-05-18 MED ORDER — MORPHINE SULFATE 2 MG/ML IJ SOLN: 2.0000 mg | INTRAMUSCULAR | Status: DC | PRN

## 2022-05-18 MED ORDER — MIDAZOLAM HCL 2 MG/2ML IJ SOLN
INTRAMUSCULAR | Status: DC | PRN
  Administered 2022-05-18: 2 mg via INTRAVENOUS

## 2022-05-18 MED ORDER — CHLORHEXIDINE GLUCONATE 0.12 % MT SOLN
15.0000 mL | Freq: Once | OROMUCOSAL | Status: AC
  Administered 2022-05-18: 15 mL via OROMUCOSAL

## 2022-05-18 MED ORDER — FENTANYL CITRATE (PF) 250 MCG/5ML IJ SOLN
INTRAMUSCULAR | Status: DC | PRN
  Administered 2022-05-18: 75 ug via INTRAVENOUS

## 2022-05-18 MED ORDER — CEFAZOLIN SODIUM-DEXTROSE 2-4 GM/100ML-% IV SOLN
2.0000 g | INTRAVENOUS | Status: AC
  Administered 2022-05-18: 2 g via INTRAVENOUS
  Filled 2022-05-18: qty 100

## 2022-05-18 MED ORDER — CHLORHEXIDINE GLUCONATE CLOTH 2 % EX PADS
6.0000 | MEDICATED_PAD | Freq: Once | CUTANEOUS | Status: AC
  Administered 2022-05-18: 6 via TOPICAL

## 2022-05-18 MED ORDER — FENTANYL CITRATE (PF) 100 MCG/2ML IJ SOLN
INTRAMUSCULAR | Status: AC
  Filled 2022-05-18: qty 2

## 2022-05-18 MED ORDER — AMISULPRIDE (ANTIEMETIC) 5 MG/2ML IV SOLN: 10.0000 mg | Freq: Once | INTRAVENOUS | Status: DC | PRN

## 2022-05-18 MED ORDER — ORAL CARE MOUTH RINSE: 15.0000 mL | Freq: Once | OROMUCOSAL | Status: AC

## 2022-05-18 MED ORDER — EPHEDRINE SULFATE-NACL 50-0.9 MG/10ML-% IV SOSY
PREFILLED_SYRINGE | INTRAVENOUS | Status: DC | PRN
  Administered 2022-05-18: 5 mg via INTRAVENOUS

## 2022-05-18 MED ORDER — KETOROLAC TROMETHAMINE 30 MG/ML IJ SOLN
30.0000 mg | Freq: Once | INTRAMUSCULAR | Status: AC
  Administered 2022-05-18: 30 mg via INTRAVENOUS

## 2022-05-18 MED ORDER — FENTANYL CITRATE (PF) 100 MCG/2ML IJ SOLN
25.0000 ug | INTRAMUSCULAR | Status: DC | PRN
  Administered 2022-05-18: 50 ug via INTRAVENOUS

## 2022-05-18 MED ORDER — OXYCODONE HCL 5 MG PO TABS: 5.0000 mg | ORAL_TABLET | Freq: Four times a day (QID) | ORAL | Status: DC | PRN

## 2022-05-18 MED ORDER — 0.9 % SODIUM CHLORIDE (POUR BTL) OPTIME
TOPICAL | Status: DC | PRN
  Administered 2022-05-18: 1000 mL

## 2022-05-18 MED ORDER — LACTATED RINGERS IV SOLN
125.0000 mL/h | INTRAVENOUS | Status: DC
  Administered 2022-05-18: 125 mL/h via INTRAVENOUS

## 2022-05-18 MED ORDER — KETOROLAC TROMETHAMINE 30 MG/ML IJ SOLN
INTRAMUSCULAR | Status: AC
  Filled 2022-05-18: qty 1

## 2022-05-18 MED ORDER — ONDANSETRON HCL 4 MG/2ML IJ SOLN
INTRAMUSCULAR | Status: DC | PRN
  Administered 2022-05-18: 4 mg via INTRAVENOUS

## 2022-05-18 MED ORDER — FENTANYL CITRATE (PF) 250 MCG/5ML IJ SOLN
INTRAMUSCULAR | Status: AC
  Filled 2022-05-18: qty 5

## 2022-05-18 MED ORDER — ACETAMINOPHEN 325 MG PO TABS: 650.0000 mg | ORAL_TABLET | ORAL | Status: DC | PRN

## 2022-05-18 MED ORDER — PROPOFOL 10 MG/ML IV BOLUS
INTRAVENOUS | Status: AC
  Filled 2022-05-18: qty 20

## 2022-05-18 MED ORDER — ACETAMINOPHEN 500 MG PO TABS
1000.0000 mg | ORAL_TABLET | Freq: Once | ORAL | Status: AC
  Administered 2022-05-18: 1000 mg via ORAL
  Filled 2022-05-18 (×2): qty 2

## 2022-05-18 MED ORDER — MIDAZOLAM HCL 2 MG/2ML IJ SOLN
INTRAMUSCULAR | Status: AC
  Filled 2022-05-18: qty 2

## 2022-05-18 MED ORDER — OXYCODONE HCL 5 MG PO TABS: 5.0000 mg | ORAL_TABLET | Freq: Once | ORAL | Status: DC | PRN

## 2022-05-18 MED ORDER — LIDOCAINE 2% (20 MG/ML) 5 ML SYRINGE
INTRAMUSCULAR | Status: DC | PRN
  Administered 2022-05-18: 100 mg via INTRAVENOUS

## 2022-05-18 SURGICAL SUPPLY — 56 items
APPLICATOR COTTON TIP 6 STRL (MISCELLANEOUS) IMPLANT
APPLICATOR COTTON TIP 6IN STRL (MISCELLANEOUS) IMPLANT
BAG COUNTER SPONGE SURGICOUNT (BAG) ×1 IMPLANT
BAG DECANTER FOR FLEXI CONT (MISCELLANEOUS) IMPLANT
BENZOIN TINCTURE PRP APPL 2/3 (GAUZE/BANDAGES/DRESSINGS) ×1 IMPLANT
CANISTER SUCT 3000ML PPV (MISCELLANEOUS) ×1 IMPLANT
CANISTER WOUNDNEG PRESSURE 500 (CANNISTER) IMPLANT
CNTNR URN SCR LID CUP LEK RST (MISCELLANEOUS) IMPLANT
CONT SPEC 4OZ STRL OR WHT (MISCELLANEOUS)
COVER SURGICAL LIGHT HANDLE (MISCELLANEOUS) ×1 IMPLANT
DRAIN CHANNEL 19F RND (DRAIN) IMPLANT
DRAIN JP 10F RND SILICONE (MISCELLANEOUS) IMPLANT
DRAPE DERMATAC (DRAPES) IMPLANT
DRAPE HALF SHEET 40X57 (DRAPES) IMPLANT
DRAPE IMP U-DRAPE 54X76 (DRAPES) ×1 IMPLANT
DRAPE INCISE IOBAN 66X45 STRL (DRAPES) IMPLANT
DRAPE LAPAROSCOPIC ABDOMINAL (DRAPES) IMPLANT
DRAPE LAPAROTOMY 100X72 PEDS (DRAPES) ×1 IMPLANT
DRSG ADAPTIC 3X8 NADH LF (GAUZE/BANDAGES/DRESSINGS) IMPLANT
DRSG CALCIUM ALGINATE 4X4 (GAUZE/BANDAGES/DRESSINGS) IMPLANT
DRSG VAC ATS LRG SENSATRAC (GAUZE/BANDAGES/DRESSINGS) IMPLANT
DRSG VAC ATS MED SENSATRAC (GAUZE/BANDAGES/DRESSINGS) IMPLANT
DRSG VAC ATS SM SENSATRAC (GAUZE/BANDAGES/DRESSINGS) IMPLANT
ELECT CAUTERY BLADE 6.4 (BLADE) IMPLANT
ELECT REM PT RETURN 9FT ADLT (ELECTROSURGICAL) ×1
ELECTRODE REM PT RTRN 9FT ADLT (ELECTROSURGICAL) ×1 IMPLANT
GAUZE PAD ABD 8X10 STRL (GAUZE/BANDAGES/DRESSINGS) IMPLANT
GAUZE SPONGE 4X4 12PLY STRL (GAUZE/BANDAGES/DRESSINGS) IMPLANT
GLOVE BIO SURGEON STRL SZ 6.5 (GLOVE) ×1 IMPLANT
GLOVE BIOGEL M 6.5 STRL (GLOVE) ×1 IMPLANT
GOWN STRL REUS W/ TWL LRG LVL3 (GOWN DISPOSABLE) ×3 IMPLANT
GOWN STRL REUS W/TWL LRG LVL3 (GOWN DISPOSABLE) ×3
KIT BASIN OR (CUSTOM PROCEDURE TRAY) ×1 IMPLANT
KIT TURNOVER KIT B (KITS) ×1 IMPLANT
NDL HYPO 25GX1X1/2 BEV (NEEDLE) ×1 IMPLANT
NEEDLE HYPO 25GX1X1/2 BEV (NEEDLE) ×1 IMPLANT
NS IRRIG 1000ML POUR BTL (IV SOLUTION) ×1 IMPLANT
PACK GENERAL/GYN (CUSTOM PROCEDURE TRAY) ×1 IMPLANT
PACK UNIVERSAL I (CUSTOM PROCEDURE TRAY) ×1 IMPLANT
PAD ARMBOARD 7.5X6 YLW CONV (MISCELLANEOUS) ×2 IMPLANT
PAD NEG PRESSURE SENSATRAC (MISCELLANEOUS) IMPLANT
STAPLER VISISTAT 35W (STAPLE) ×1 IMPLANT
SURGILUBE 2OZ TUBE FLIPTOP (MISCELLANEOUS) IMPLANT
SUT ETHILON 4 0 CL P 3 (SUTURE) IMPLANT
SUT MNCRL AB 3-0 PS2 27 (SUTURE) IMPLANT
SUT MNCRL AB 4-0 PS2 18 (SUTURE) IMPLANT
SUT MON AB 2-0 CT1 36 (SUTURE) IMPLANT
SUT MON AB 5-0 PS2 18 (SUTURE) IMPLANT
SUT PROLENE 4 0 P 3 18 (SUTURE) IMPLANT
SUT VIC AB 5-0 PS2 18 (SUTURE) IMPLANT
SUT VICRYL 3 0 (SUTURE) IMPLANT
SWAB COLLECTION DEVICE MRSA (MISCELLANEOUS) IMPLANT
SWAB CULTURE ESWAB REG 1ML (MISCELLANEOUS) IMPLANT
SYR CONTROL 10ML LL (SYRINGE) ×1 IMPLANT
TOWEL GREEN STERILE (TOWEL DISPOSABLE) ×1 IMPLANT
UNDERPAD 30X36 HEAVY ABSORB (UNDERPADS AND DIAPERS) ×1 IMPLANT

## 2022-05-18 NOTE — Progress Notes (Addendum)
No changes over night, wound vac in place.  Tolerating diet and ambulating.  The patient has no clinical indication of malnutrition/ nutritional deficiency.  Slept better with home meds.  Plan wound vac change in OR. Home post procedure.

## 2022-05-18 NOTE — Discharge Summary (Signed)
Physician Discharge Summary  Patient ID: Kim Fischer MRN: [id number] DOB/AGE: 08-05-1970 51 y.o.  Admit date: 05/15/2022 Discharge date: 05/18/2022  Admission Diagnoses:  Discharge Diagnoses:  Principal Problem:   Wound dehiscence Active Problems:   Wound of right breast   Discharged Condition: good  Hospital Course: Kim Fischer was admitted to the hospital on Friday, November 3 from the clinic after she was found to have necrotic tissue in the right breast from a breast biopsy done approximately 1 month prior.  She was taken to the operating room urgently on Friday night where she underwent debridement of the necrotic tissue and placement of wound VAC.  She remained in the hospital awaiting a home wound VAC device.  On Monday since she was still in the hospital she was returned to the operating room where she underwent wound VAC change and partial closure of her wounds.  There have been no complications and she tolerated both the procedures and her hospitalization well.  Consults: None  Significant Diagnostic Studies: No diagnostic studies were obtained during her hospitalization  Treatments: surgery: Wound debridement and wound VAC placement November 3.  Wound VAC change November 6  Discharge Exam: Blood pressure 132/66, pulse 68, temperature 97.9 F (36.6 C), temperature source Oral, resp. rate 16, height 5\' 2"  (1.575 m), weight 90.7 kg, last menstrual period 04/16/2022, SpO2 95 %. Incision/Wound: Clean with decreased erythema.  Wound VAC in place.  Disposition: Discharge disposition: 01-Home or Self Care       Discharge Instructions     Call MD for:  persistant nausea and vomiting   Complete by: As directed    Call MD for:  redness, tenderness, or signs of infection (pain, swelling, redness, odor or green/yellow discharge around incision site)   Complete by: As directed    Call MD for:  severe uncontrolled pain   Complete by: As directed    Call MD for:   temperature >100.4   Complete by: As directed    Diet - low sodium heart healthy   Complete by: As directed    Discharge instructions   Complete by: As directed    Follow-up in plastic surgery clinic on Friday, November 10 May shower or bathe as long as you do not get the dressings wet. Diet of choice Resume all home meds Call for any questions or concerns [phone]   Discharge wound care:   Complete by: As directed    You do not need to do anything with the wound VAC.  We will change it in clinic on Friday.  Call for any concerns.   Increase activity slowly   Complete by: As directed       Allergies as of 05/18/2022   No Known Allergies      Medication List     TAKE these medications    ALPRAZolam 1 MG tablet Commonly known as: XANAX Take 1 mg by mouth at bedtime.   collagenase 250 UNIT/GM ointment Commonly known as: SANTYL Apply 1 Application topically daily.   ferrous gluconate 324 MG tablet Commonly known as: FERGON Take 324 mg by mouth every morning.   levothyroxine 100 MCG tablet Commonly known as: SYNTHROID Take by mouth.   metFORMIN 500 MG tablet Commonly known as: GLUCOPHAGE Take 500 mg by mouth 2 (two) times daily with a meal.   ondansetron 4 MG disintegrating tablet Commonly known as: ZOFRAN-ODT Take 1 tablet (4 mg total) by mouth every 8 (eight) hours as needed for nausea or vomiting.  oxyCODONE 5 MG immediate release tablet Commonly known as: Roxicodone Take 1 tablet (5 mg total) by mouth every 8 (eight) hours as needed for up to 10 doses for severe pain.   pantoprazole 40 MG tablet Commonly known as: PROTONIX Take 40 mg by mouth daily.   pramipexole 0.25 MG tablet Commonly known as: MIRAPEX Take 0.5 mg by mouth at bedtime.   venlafaxine XR 75 MG 24 hr capsule Commonly known as: EFFEXOR-XR Take 75 mg by mouth daily.               Discharge Care Instructions  (From admission, onward)           Start     Ordered    05/18/22 0000  Discharge wound care:       Comments: You do not need to do anything with the wound VAC.  We will change it in clinic on Friday.  Call for any concerns.   05/18/22 1700            Follow-up Information     [facility] Follow up.   Specialty: Plastic Surgery Why: Follow up on Monday 11/6 Contact information: [address] [place] [address] [phone]        [facility] Follow up.   Why: Wound VAC orders submiited to [facility]-               Dr. Loren Racer [address] [place], [place]  [address] [phone]  Signed: Santiago Fischer 05/18/2022, 5:09 PM

## 2022-05-18 NOTE — Anesthesia Postprocedure Evaluation (Signed)
Anesthesia Post Note  Patient: Kim Fischer  Procedure(s) Performed: DEBRIDEMENT RIGHT BREAST WITH WOUND VAC CHANGE (Right)     Patient location during evaluation: PACU Anesthesia Type: General Level of consciousness: awake Pain management: pain level controlled Vital Signs Assessment: post-procedure vital signs reviewed and stable Respiratory status: spontaneous breathing, nonlabored ventilation and respiratory function stable Cardiovascular status: blood pressure returned to baseline and stable Postop Assessment: no apparent nausea or vomiting Anesthetic complications: no   No notable events documented.  Last Vitals:  Vitals:   05/18/22 1515 05/18/22 1559  BP: 125/65 132/66  Pulse: 78 68  Resp: 16 16  Temp: 36.6 C 36.6 C  SpO2: 92% 95%    Last Pain:  Vitals:   05/18/22 1559  TempSrc: Oral  PainSc:                  Matisse Salais P Saxon Barich

## 2022-05-18 NOTE — Plan of Care (Signed)

## 2022-05-18 NOTE — H&P (View-Only) (Signed)
No changes over night, wound vac in place.  Tolerating diet and ambulating.  The patient has no clinical indication of malnutrition/ nutritional deficiency.  Slept better with home meds.  Plan wound vac change in OR. Home post procedure. 

## 2022-05-18 NOTE — Op Note (Signed)
DATE OF OPERATION: 05/18/2022  LOCATION: [facility]  PREOPERATIVE DIAGNOSIS:Breast Wound  POSTOPERATIVE DIAGNOSIS: Same  PROCEDURE: Wound vac change, wound washout  SURGEON: Jeanann Lewandowsky, MD  ASSISTANT: Krista Blue  EBL: 10 cc  CONDITION: Stable  COMPLICATIONS: None  INDICATION: The patient, Kim Fischer, is a 51 y.o. female born on [birth year], is here for treatment of a breast complication following breast reduction surgery.  I took her to surgery on Friday night for extensive debridement of necrotic tissue in the right breast.  After completing the debridement  I placed a wound VAC.  She did well over the weekend and I took her back to the operating room today for wound VAC change.  WOUND SIZE: 10 cm in depth, 5 cm in length, 2 cm in width.  PROCEDURE DETAILS:  The patient was seen prior to surgery and marked.   IV antibiotics were given. The patient was taken to the operating room and given a general anesthetic. A standard time out was performed and all information was confirmed by those in the room. SCDs were placed.   Her chest was prepped and draped in a sterile fashion.  I removed the wound VAC.  I irrigated the wound with normal saline. Inspection of the wound showed that there was no further necrotic tissue.  I reapproximated the vertical incision in the horizontal incision with interrupted 4-0 Prolene sutures.  There was a cavity approximately 10 cm deep 5 cm in length and 2 cm wide that remained.  I trimmed the wound VAC sponge to the approximate dimensions of the wound placed the wound VAC sponge into the cavity skin protectors were placed around the incision the wound VAC drapes and hose were applied.  The the wound VAC was placed to 125 mm of suction.  I reinforced the wound VAC adhesive drapes with Ioban drapes.  All instrument needle and sponge counts were reported as correct. The patient was allowed to wake up and taken to recovery room in stable condition at  the end of the case. The family was notified at the end of the case.   The advanced practice practitioner (APP), Krista Blue assisted throughout the case.  The APP was essential in retraction and counter traction when needed to make the case progress smoothly.  This retraction and assistance made it possible to see the tissue plans for the procedure.  The assistance was needed for blood control, tissue re-approximation and assisted with closure of the incision site.

## 2022-05-18 NOTE — Progress Notes (Signed)
Evette,RN gave patient discharge instructions and she stated understanding. IV has been removed and the patient has been hooked up to her home wound vac. Supplies have been sent home with the patient

## 2022-05-18 NOTE — Anesthesia Preprocedure Evaluation (Addendum)
Anesthesia Evaluation  Patient identified by MRN, date of birth, ID band  History of Anesthesia Complications Negative for: history of anesthetic complications  Airway Mallampati: II  TM Distance: >3 FB Neck ROM: Full    Dental no notable dental hx.    Pulmonary neg pulmonary ROS   Pulmonary exam normal breath sounds clear to auscultation       Cardiovascular negative cardio ROS  Rhythm:Regular Rate:Normal     Neuro/Psych  PSYCHIATRIC DISORDERS  Depression    negative neurological ROS     GI/Hepatic ,GERD  ,,  Endo/Other  neg diabetesHypothyroidism    Renal/GU      Musculoskeletal   Abdominal  (+) + obese  Peds  Hematology  (+) Blood dyscrasia, anemia   Anesthesia Other Findings   Reproductive/Obstetrics                             Anesthesia Physical Anesthesia Plan  ASA: 2  Anesthesia Plan: General   Post-op Pain Management:    Induction: Intravenous  PONV Risk Score and Plan: 3 and Ondansetron and Midazolam  Airway Management Planned: LMA  Additional Equipment:   Intra-op Plan:   Post-operative Plan:   Informed Consent: I have reviewed the patients History and Physical, chart, labs and discussed the procedure including the risks, benefits and alternatives for the proposed anesthesia with the patient or authorized representative who has indicated his/her understanding and acceptance.     Dental advisory given  Plan Discussed with:   Anesthesia Plan Comments:         Anesthesia Quick Evaluation

## 2022-05-18 NOTE — Transfer of Care (Signed)
Immediate Anesthesia Transfer of Care Note  Patient: Kim Fischer  Procedure(s) Performed: DEBRIDEMENT RIGHT BREAST WITH WOUND VAC CHANGE (Right)  Patient Location: PACU  Anesthesia Type:General  Level of Consciousness: awake, alert , and oriented  Airway & Oxygen Therapy: Patient Spontanous Breathing  Post-op Assessment: Report given to RN and Post -op Vital signs reviewed and stable  Post vital signs: Reviewed and stable  Last Vitals:  Vitals Value Taken Time  BP 126/67 05/18/22 1445  Temp 36.6 C 05/18/22 1445  Pulse 82 05/18/22 1450  Resp 16 05/18/22 1450  SpO2 94 % 05/18/22 1450  Vitals shown include unvalidated device data.  Last Pain:  Vitals:   05/18/22 1053  TempSrc:   PainSc: 0-No pain      Patients Stated Pain Goal: 3 (53/74/82 7078)  Complications: No notable events documented.

## 2022-05-18 NOTE — TOC Progression Note (Addendum)
Transition of Care [facility]) - Progression Note    Patient Details  Name: RANATA LAUGHERY MRN: [id number] Date of Birth: 1971-03-04  Transition of Care [facility]) CM/SW Contact  Jacalyn Lefevre Edson Snowball, RN Phone Number: 05/18/2022, 3:05 PM  Clinical Narrative:     Followed up with Olivia Mackie with 3 M regarding home VAC. It is in clinical review with insurance.   no home health needed because VAC will be changed in Dr Tanna Furry office    [place] from [facility] called they need  OP note from today with measurements documented in today's op note  and a progress note stating  nutrition status . Secure chatted MD   Received approval for home VAC . 59M sending NCM paperwork now. Nurse and MD updated   1700 home VAC at bedside    Expected Discharge Plan: Home/Self Care Barriers to Discharge: Other (must enter comment) (Awaiting insurance approval for wound vac delivery.)  Expected Discharge Plan and Services Expected Discharge Plan: Home/Self Care In-house Referral: NA Discharge Planning Services: CM Consult Post Acute Care Choice: Durable Medical Equipment Living arrangements for the past 2 months: Single Family Home                 DME Arranged: Negative pressure wound device DME Agency: [facility] Date DME Agency Contacted: 05/16/22 Time DME Agency Contacted: 1137 Representative spoke with at DME Agency: Olivia Mackie HH Arranged: NA (patient will have vac changes in the office.)           Social Determinants of Health (SDOH) Interventions    Readmission Risk Interventions     No data to display

## 2022-05-18 NOTE — Discharge Instructions (Addendum)
Plan to return to clinic on Friday for your appointment, as scheduled.  Please do not change wound VAC in interim.  Take the prescribed pain medication, as needed. Supplement with Tylenol and NSAIDs.  Regular diet.  You can shower as long as you do not get the dressings wet.    Please call the clinic should you have any questions or concerns.

## 2022-05-18 NOTE — Anesthesia Procedure Notes (Signed)
Procedure Name: LMA Insertion Date/Time: 05/18/2022 1:23 PM  Performed by: Gaylene Brooks, CRNAPre-anesthesia Checklist: Patient identified, Emergency Drugs available, Suction available and Patient being monitored Patient Re-evaluated:Patient Re-evaluated prior to induction Oxygen Delivery Method: Circle System Utilized Preoxygenation: Pre-oxygenation with 100% oxygen Induction Type: IV induction Ventilation: Mask ventilation without difficulty LMA: LMA inserted LMA Size: 4.0 Number of attempts: 1 Airway Equipment and Method: Bite block Placement Confirmation: positive ETCO2 Tube secured with: Tape Dental Injury: Teeth and Oropharynx as per pre-operative assessment

## 2022-05-18 NOTE — H&P (Signed)
Procedure discussed with Ms Kim Fischer. No changes.  Site marked.  Will proceed with wound vac change and possible debridement at her request.

## 2022-05-19 ENCOUNTER — Encounter (HOSPITAL_COMMUNITY): Payer: Self-pay | Admitting: Plastic Surgery

## 2022-05-21 ENCOUNTER — Telehealth: Payer: Self-pay | Admitting: *Deleted

## 2022-05-21 NOTE — Telephone Encounter (Signed)
Received on (05/20/22) via of fax Physician's Prescription [facility] from [facility].  Requesting signature,date,and complete the order.  Given to provider to complete.    Physician's Prescription completed and faxed back to [facility].  Confirmation received and copy scanned into the chart.//AB/CMA

## 2022-05-22 ENCOUNTER — Encounter: Payer: Self-pay | Admitting: Student

## 2022-05-22 ENCOUNTER — Ambulatory Visit (INDEPENDENT_AMBULATORY_CARE_PROVIDER_SITE_OTHER): Payer: 59 | Admitting: Student

## 2022-05-22 VITALS — BP 126/71 | HR 99

## 2022-05-22 DIAGNOSIS — Z9889 Other specified postprocedural states: Secondary | ICD-10-CM

## 2022-05-22 NOTE — Progress Notes (Signed)
Patient is a 51 year old female who underwent bilateral breast reduction with Dr. Erin Hearing on 04/14/2022.  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  She was later seen in the clinic on 05/15/2022 with concern for infection.  Patient was later taken that evening to the operating room with Dr. Lovena Le for debridement of necrotic tissue to the right breast and application of a wound VAC to the right breast.  Patient later underwent debridement of the right breast with a wound VAC change on 05/18/2022 with Dr. Lovena Le.  Patient presents to the clinic today for postoperative follow-up and wound VAC change.  Today patient reports she is doing well.  She states that she has been feeling much better.  She denies any issues with the wound vac.  Dr. Lovena Le also had the opportunity to see the patient and examine her at today's visit.  Chaperone none on exam.  On exam, patient is sitting upright in no acute distress.  Wound VAC was removed to the right breast wound.  Tissue throughout the wound bed appears to be healthy and well vascularized.  There is no purulent drainage or malodor.  There are no signs of infection on exam.  There are several Prolene sutures that are intact noted along the vertical limb and a few just inferior/lateral to the wound.  Donated myriad morsels were placed within the wound bed.  Black wound VAC sponge wrapped in Sorbact was placed within the wound.  Another piece of wound VAC sponge was placed over the top of the wound.  Wound VAC drapes were applied.  Adequate suction was obtained at 125 mmHg.  We discussed with the patient that she will keep the wound VAC on for the next 7 to 10 days.  We discussed with the patient that we will plan to see her back in clinic in 10 days, but if she has issues or concerns she can return in 1 week for evaluation and wound VAC change.  I did discuss with the patient that if she  does experience issues with the wound VAC or has any concerns before then, she can reach out to [place] with any questions that she has.  Patient expressed understanding was in agreement with this plan.  I discussed with the patient that she should avoid getting this area wet.  I discussed that she should avoid strenuous activities.  I recommended the patient take Tylenol or ibuprofen if she is having pain, and take oxycodone if there is no relief with Tylenol or ibuprofen.  Patient expressed understanding.  Patient to follow-up in 10 days.  I instructed the patient call if she has any questions or concerns.  Pictures were obtained of the patient and placed in the chart with the patient's or guardian's permission.

## 2022-05-25 ENCOUNTER — Encounter: Payer: Self-pay | Admitting: Student

## 2022-05-25 ENCOUNTER — Encounter: Payer: 59 | Admitting: Physician Assistant

## 2022-05-25 ENCOUNTER — Ambulatory Visit (INDEPENDENT_AMBULATORY_CARE_PROVIDER_SITE_OTHER): Payer: 59 | Admitting: Student

## 2022-05-25 VITALS — BP 121/61 | HR 81 | Temp 98.1°F | Wt 206.0 lb

## 2022-05-25 DIAGNOSIS — Z9889 Other specified postprocedural states: Secondary | ICD-10-CM

## 2022-05-25 NOTE — Progress Notes (Signed)
Patient is a 51 year old female who underwent bilateral breast reduction with Dr. Erin Hearing on 04/14/2022.  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  She was later seen in the clinic on 05/15/2022 with concern for infection.  Patient was later taken that evening to the operating room with Dr. Lovena Le for debridement of necrotic tissue to the right breast and application of a wound VAC to the right breast.  Patient later underwent debridement of the right breast with a wound VAC change on 05/18/2022 with Dr. Lovena Le.     Patient presents to the clinic today with concern of the drainage in the wound VAC and tenderness at the surgical site.  Patient was last seen in the clinic on 05/22/2022.  At this visit, patient reported she was doing well.  On exam, wound VAC to the right breast was removed.  Tissue throughout the wound bed appeared to be healthy and well vascularized.  There were no signs of infection on exam.  Donated myriad morsels were placed within the wound bed.  Black wound VAC sponge wrapped in Sorbact was placed within the wound, and another piece of wound VAC was placed over the top of the wound.  Wound VAC drapes were placed over the sponge.  Adequate suction was obtained at 125 mmHg.  Plan was for patient to follow-up in 10 days for wound VAC change.  Today, patient reports she is doing okay.  She states that yesterday she started to feel tired and worn out.  She also reports that she feels like she was having some bloating and stomach aches.  She denied any nausea or vomiting.  She denied any fevers or chills.  Patient also reports there is a little bit of a difference in the output from her wound VAC.  Patient reports she has been taking doxycycline and thinks that she has 1 more day left of the doxycycline.  Chaperone present on exam.  On exam, patient is sitting upright in no acute distress.  Vitals are stable  at today's visit.  Patient is afebrile.  Wound VAC is in place to the right breast wound.  It appears to have adequate seal and is suctioning at 125 mmHg.  There is no surrounding erythema noted to the breast.  The breast is soft.  There are no obvious signs of infection on exam.  There is a scant amount of cloudy drainage noted in the canister near the tubing insertion site.  Canister was changed here in clinic.  I discussed with the patient that there are no obvious signs of infection on her exam.  I discussed with the patient that she should continue to monitor her symptoms.  I discussed with her that if she notices any worsening symptoms or has any concerns to let us know.  Patient expressed understanding.  I discussed with the patient that she may start gradually increasing her activities.  I encouraged the patient to make sure that she is drinking plenty of fluids.  Patient expressed understanding.  Plan is to see patient in 1 week for wound VAC change here in the clinic.  Patient to come back sooner if there is an issue or concern.  Instructed patient to call in the meantime if she has any questions or concerns.  Dr. Lovena Le also had the opportunity to examine the patient and discussed the plan with her.

## 2022-05-26 NOTE — Addendum Note (Signed)
Addended by: Sherol Dade on: 05/26/2022 04:24 PM   Modules accepted: Orders

## 2022-05-28 NOTE — Interval H&P Note (Signed)
History and Physical Interval Note:  05/28/2022 4:59 PM  Kim Fischer  has presented today for surgery, with the diagnosis of Right breast necrosis.  The various methods of treatment have been discussed with the patient and family. After consideration of risks, benefits and other options for treatment, the patient has consented to  Procedure(s): DEBRIDEMENT RIGHT BREAST WITH WOUND VAC CHANGE (Right) as a surgical intervention.  The patient's history has been reviewed, patient examined, no change in status, stable for surgery.  I have reviewed the patient's chart and labs.  Questions were answered to the patient's satisfaction.     Santiago Glad

## 2022-05-29 ENCOUNTER — Encounter: Payer: 59 | Admitting: Physician Assistant

## 2022-06-01 ENCOUNTER — Ambulatory Visit (INDEPENDENT_AMBULATORY_CARE_PROVIDER_SITE_OTHER): Payer: 59 | Admitting: Student

## 2022-06-01 DIAGNOSIS — Z9889 Other specified postprocedural states: Secondary | ICD-10-CM

## 2022-06-01 DIAGNOSIS — T8131XA Disruption of external operation (surgical) wound, not elsewhere classified, initial encounter: Secondary | ICD-10-CM | POA: Diagnosis not present

## 2022-06-01 NOTE — Progress Notes (Signed)
Patient is a 51 year old female who underwent bilateral breast reduction with Dr. Domenica Reamer on 04/14/2022.  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  She was later seen in the clinic on 05/15/2022 with concern for infection.  Patient was later taken that evening to the operating room with Dr. Ladona Ridgel for debridement of necrotic tissue to the right breast and application of a wound VAC to the right breast.  Patient later underwent debridement of the right breast with a wound VAC change on 05/18/2022 with Dr. Ladona Ridgel.   Patient presents to the clinic today for wound VAC change and evaluation.  Patient was last seen in the clinic on 05/25/2022.  At this visit, she had concerns about some tenderness near the surgical site.  On exam, wound VAC was in place to the right breast.  It appeared to have adequate seal.  There is no surrounding erythema noted.  There were no signs of infection on exam.  There is a scant amount of cloudy drainage noted to the canister near the tubing insertion site.  Plan is for patient to follow-up in 1 week for wound VAC change.  Today, patient reports she is doing well.  Patient states that she has been gradually increasing her activities.  She reports that the wound VAC has held its seal since it was changed.  She reports she smells a little bit of an odor, but it is not as strong as what it was before surgery.  She denies any other issues or concerns.  She denies any fevers or chills.  Chaperone present on exam.  On exam, patient is sitting upright in no acute distress.  Left breast was soft.  There is no overlying erythema.  NAC appear to be viable to the left.  The incisions are intact and healing well.  To the right breast, the wound VAC was removed.  There was a slight odor, but appeared to have improved once the wound VAC sponge was removed.  The skin surrounding the wound VAC appears to be slightly  irritated and the skin to the right inframammary area near the wound VAC appears to be slightly irritated.  The wound is approximately 10 cm x 6 cm x 7 cm.  It appears there is healthy granulation tissue within the wound that is well vascularized.  There is a minimal amount of fibrinous exudate noted to the medial aspect of the wound.  There is no purulent drainage noted within the wound.   Dermatac was placed over the irritated skin to the inframammary area.  Black wound VAC sponge was placed within the wound and care was taken to avoid the surrounding skin.  Dermatac was then placed over the wound VAC sponge.  Edges were reinforced with wound VAC drape.  Lilly pad was then attached and tubing was connected to the wound VAC.  Continuous suction was placed at 125 mmHg.  Adequate seal was achieved.  Patient tolerated the wound VAC change well.  I discussed with the patient to continue the wound VAC at all times and to avoid getting the area wet. Patient expressed understanding.  I discussed with the patient that we will have her come back possibly on Wednesday for another wound VAC change to get her through the holiday weekend, and then have her come back on Monday for another wound VAC change after that.  Patient was in agreement with this plan.  Pictures were obtained of  the patient and placed in the chart with the patient's or guardian's permission.   I instructed the patient to call in the meantime if she has any questions or concerns.

## 2022-06-02 NOTE — Progress Notes (Signed)
Patient is a 51 year old female who underwent bilateral breast reduction with Dr. Erin Hearing on 04/14/2021.  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  Patient later went to the operating room for debridement of the necrotic tissue with Dr. Lovena Le on 05/15/2022.  She then underwent debridement of the right breast wound with wound VAC change on 05/18/2022 Dr. Lovena Le.  Patient presents to the clinic today for wound VAC change.  Patient was last seen in the clinic on 06/01/2022.  At this visit, patient reported she was doing well.  On exam, the wound VAC was removed from the right breast.  The skin surrounding the wound VAC appears to be slightly irritated as well as the skin to the right inframammary area.  The wound was approximately 10 cm x 6 cm x 7 cm.  There appeared to be healthy granulation tissue within the wound that was well vascularized.  There is no purulent drainage noted.  Wound VAC was changed and placed to 125 mmHg on continuous suction.  Plan is for patient to return to the clinic in a few days for another wound VAC change before the holiday weekend.  Patient is accompanied by her husband at bedside.  Today, patient reports she is doing well.  She denies any new major issues or changes.  She states the skin near the wound site looks a little irritated.  She reports there is a little bit of bloody drainage in the canister.    Chaperone present on exam.  On exam, patient is sitting upright in no acute distress.  Left breast is soft.  Left NAC is viable.  Incisions are healing well.  There is no overlying erythema or significant fluid collections palpated on exam.  Wound VAC was removed from the right breast wound.  Skin edges surrounding the wound looks slightly irritated, but look a little improved from previous exam.  There is also a little irritation noted to the skin just lateral to the wound, but this looks also improved from previous exam.  There is no  erythema otherwise noted to the skin overlying the right breast.  Tissue within the wound bed appears to be healthy and well vascularized.  There is some fibrinous exudate noted to the medial wall of the wound.  There is no purulent drainage noted on exam.  There appears to be minimal bloody/serosanguineous drainage in the canister of the wound VAC.    Donated myriad morsels were placed throughout the wound bed.  Black wound VAC sponge wrapped in Adaptic with KY jelly overlying it was placed within the wound.  Another piece of black wound VAC sponge was placed over the top of the other sponge, taking care to try to avoid contact with the surrounding skin. Dermatac was placed directly over the areas of irritation. Dermatac was then placed over the wound VAC sponge, and reinforced with wound VAC drapes.  Lilly pad was placed and new canister was placed on the wound VAC.  Tubing was connected and wound VAC turned on.  Adequate seal was achieved at 125 mmHg on continuous therapy.  I discussed with the patient that we can order white wound VAC foam for within the wound so the changes may be a little easier and it may be a little gentler on the wound bed.  I discussed with the patient that we will want to see her back on Monday for another wound VAC change.  I discussed with her at this visit talk with  Dr. Lovena Le more about what the next steps moving forward.  Patient expressed understanding was in agreement with this plan.  I discussed with the patient that she may call us if she has any questions or concerns.  Pictures were obtained of the patient and placed in the chart with the patient's or guardian's permission.

## 2022-06-03 ENCOUNTER — Telehealth: Payer: Self-pay

## 2022-06-03 ENCOUNTER — Ambulatory Visit (INDEPENDENT_AMBULATORY_CARE_PROVIDER_SITE_OTHER): Payer: 59 | Admitting: Student

## 2022-06-03 VITALS — BP 119/81 | HR 88 | Temp 97.7°F | Resp 18

## 2022-06-03 DIAGNOSIS — T8131XA Disruption of external operation (surgical) wound, not elsewhere classified, initial encounter: Secondary | ICD-10-CM

## 2022-06-03 DIAGNOSIS — Z9889 Other specified postprocedural states: Secondary | ICD-10-CM

## 2022-06-03 NOTE — Telephone Encounter (Addendum)
Faxed order form for whitefoam dressing and dermatac to [facility] and requested delivery for 06/08/22. Received success confirmation. Will forward order form to front desk for batch scanning.

## 2022-06-08 ENCOUNTER — Ambulatory Visit (INDEPENDENT_AMBULATORY_CARE_PROVIDER_SITE_OTHER): Payer: 59 | Admitting: Student

## 2022-06-08 VITALS — BP 126/81 | HR 89

## 2022-06-08 DIAGNOSIS — Z9889 Other specified postprocedural states: Secondary | ICD-10-CM

## 2022-06-08 DIAGNOSIS — T8131XA Disruption of external operation (surgical) wound, not elsewhere classified, initial encounter: Secondary | ICD-10-CM | POA: Diagnosis not present

## 2022-06-08 NOTE — Progress Notes (Signed)
Patient is a 51 year old female who underwent bilateral breast reduction with Dr. Erin Hearing on 04/14/2021.  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  Patient later went to the operating room for debridement of the necrotic tissue with Dr. Lovena Le on 05/15/2022.  She then underwent debridement of the right breast wound with wound VAC change on 05/18/2022 Dr. Lovena Le.  Patient presents to the clinic today for wound VAC change     Patient was last seen in the clinic on 06/03/2022.  At this visit, she reported she was doing well.  On exam, there is a little bit of skin irritated noted around the wound and a little bit to the inframammary area just lateral of the wound.  The tissue within the wound bed appeared to be healthy.  Donated myriad morsels were placed throughout the wound bed and a wound VAC was placed over the wound.  Adequate seal was achieved at 125 mmHg.  Plan is for patient to return the following week for wound VAC change.  Patient is accompanied by her husband at bedside today.  Today, patient reports she is doing well.  She states that over the weekend, the tubing to her wound VAC got caught on a piece of furniture and the wound VAC was tugged very hard.  Patient states that she has had some pain to the breast ever since this.  She denies any fevers or chills.  She denies any other issues at this time.  Patient reports that she took an oxycodone this morning for the pain and for the wound VAC change.  She states that this is the only time that she has had to take an oxycodone.  Chaperone present on exam.  On exam, patient is sitting upright in no acute distress.  Left breast is soft.  NAC is viable.  Incisions appear to be healing well.  There is no overlying erythema.  To the right breast, there is some irritation to the skin noted near the axilla, the skin near the wound edges and into the medial inframammary area that appears to be consistent with  irritation from the wound VAC drapes.  There is no other overlying erythema noted.  The wound is approximately 6 cm x 4 cm x 3.5 cm in size.  The tissue within the wound bed appears to be healthy and well vascularized.  There is no purulent drainage noted on exam.  Wound appears to be improving.  Patient reports slight improvement in pain after wound VAC was taken off today.  Wound VAC was replaced with 1 piece of black foam sponge.  Adequate seal was achieved at 125 mmHg.  Dr. Lovena Le also had the opportunity to examine the patient today and discussed the plan with her.  We discussed with the patient that given that she is healing so well, we can take her to the operating room next week to try and close more of the wound.  Patient expressed understanding and was in agreement with this plan.  We will plan to do another wound VAC change in the meantime this upcoming Friday and next Monday.  Patient expressed understanding.  I discussed with the patient to continue to monitor her pain.  I discussed with her that she should continue to take Tylenol and ibuprofen for pain, and only take oxycodone if needed.  Patient expressed understanding.  I instructed the patient to call in the meantime if she has any questions or concerns.

## 2022-06-12 ENCOUNTER — Ambulatory Visit (INDEPENDENT_AMBULATORY_CARE_PROVIDER_SITE_OTHER): Payer: 59 | Admitting: Student

## 2022-06-12 DIAGNOSIS — Z9889 Other specified postprocedural states: Secondary | ICD-10-CM

## 2022-06-13 NOTE — Progress Notes (Signed)
Patient is a 51 year old female who underwent bilateral breast reduction with Dr. Domenica Reamer on 04/14/2021.  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  Patient later went to the operating room for debridement of the necrotic tissue with Dr. Ladona Ridgel on 05/15/2022.  She then underwent debridement of the right breast wound with wound VAC change on 05/18/2022 Dr. Ladona Ridgel.  Patient presents to the clinic today for wound VAC change         Patient was last seen in the clinic on 06/03/2022.  At this visit, patient reported she is doing well.  She reported some breast pain which she attributed to the wound VAC tubing getting caught on some furniture.  She denied any other issues at that time.  On exam, the right breast wound was approximately 6 cm x 4 cm x 3.5 centimeters in size.  Wound appears to be healing well.  Tissue appeared to be healthy with throughout the wound bed.  There is a little irritation to the skin noted near the axilla and near the wound edges.  Dr. Ladona Ridgel also examined the patient at that time.  The plan was to take the patient to the operating room the following week.  Plan is for patient to come in for another wound VAC change in the meantime.  Today, patient reports she is doing well.  She states that she has had some pain to her right breast, but has been taking Tylenol and ibuprofen for mainly.  She states that she has been increasing her activities this week and feels it could be related to that.  She denies any fevers or chills she denies any other issues at this time.  Chaperone present on exam.  On exam, patient is sitting upright in no acute distress.  Left breast is soft.  There is no overlying erythema.  Left NAC is viable.  Incisions are intact and healing well.  To the right breast, wound VAC was removed from the wound.  Wound was approximately 5.5 cm x 3 cm x 2 cm.  Irritation to the skin edges, the inframammary region and to the lateral  aspect of the breast appear to be improving.  The wound bed appears to be healthy and well vascularized.  There is no purulent drainage noted.  There is no overlying erythema to the breast.  Black wound VAC sponge was wrapped in Adaptic and K-Y jelly was placed over the Adaptic.  Wound VAC sponge was placed within the wound bed and dermatac was placed over the wound VAC sponge.  Adequate seal was achieved at 125 mmHg.  I discussed with the patient that I would like her to come back for 1 more wound VAC change on Monday prior to her surgery next Friday.  Patient expressed understanding.  I discussed with the patient that she can continue to take Tylenol and ibuprofen if she is having some pain.  I discussed with the patient to call us if her pain is worsening.  Patient expressed understanding.  Patient to follow-up on Monday.  Pictures were obtained of the patient and placed in the chart with the patient's or guardian's permission.

## 2022-06-15 ENCOUNTER — Encounter (HOSPITAL_BASED_OUTPATIENT_CLINIC_OR_DEPARTMENT_OTHER): Payer: Self-pay | Admitting: Plastic Surgery

## 2022-06-15 ENCOUNTER — Other Ambulatory Visit: Payer: Self-pay

## 2022-06-15 ENCOUNTER — Ambulatory Visit (INDEPENDENT_AMBULATORY_CARE_PROVIDER_SITE_OTHER): Payer: 59 | Admitting: Student

## 2022-06-15 VITALS — BP 140/88 | HR 98

## 2022-06-15 DIAGNOSIS — Z9889 Other specified postprocedural states: Secondary | ICD-10-CM

## 2022-06-15 NOTE — Progress Notes (Signed)
Patient is a 51-year-old female who underwent bilateral breast reduction with Dr. Luppens on 04/14/2022.  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  Patient later went to the operating room for debridement of the necrotic tissue with Dr. Taylor on 05/15/2022.  She then underwent debridement of the right breast wound with wound VAC change on 05/18/2022 with Dr. Taylor.  Patient has been following up in the clinic regularly for wound VAC changes.  Patient presents to the clinic today for another wound VAC change.  Patient was last seen in the clinic on 06/12/2022.  At this visit, she reported she was doing well.  The right breast wound was approximately 5.5 cm x 3 cm x 2 cm.  The irritation to the skin appeared to be have been improving.  The wound bed appeared to be healthy.  Black wound sponge wrapped in Adaptic and K-Y jelly was placed over the wound.  Adequate seal was achieved at 125 mmHg.  Plan is for patient to come in in a few days for 1 final wound VAC change prior to her surgery on Friday 06/19/2022.  Today, patient reports she is doing well.  She has no new complaints or concerns.  She denies any fevers or chills.  Chaperone present on exam.  On exam, patient is sitting upright in no acute distress.  Right breast wound VAC sponge and Adaptic was removed.  Wound is approximately 5.5 cm x 2.5 cm x 1.5 cm.  There is no purulence noted on exam.  Wound bed appears to be healthy and well vascularized.  There is a little bit of irritation noted to the skin to the inframammary region.  Otherwise there is no overlying erythema to the skin to the right breast.   Black wound VAC sponge was wrapped in Adaptic and K-Y jelly was applied over the Adaptic this was placed in the wound to the right breast.  Derma tack was placed over the inframammary area.  Wound VAC drapes were placed over the wound VAC was a Lilly pad then placed.  Adequate seal was achieved at 125 mmHg.  I  discussed with the patient that we will take the wound VAC off during surgery on Friday.  I discussed with the patient to call in the meantime if she has any questions or concerns.  Patient expressed understanding.   

## 2022-06-15 NOTE — Progress Notes (Signed)

## 2022-06-15 NOTE — H&P (View-Only) (Signed)
Patient is a 51 year old female who underwent bilateral breast reduction with Dr. Domenica Reamer on 04/14/2022.  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  Patient later went to the operating room for debridement of the necrotic tissue with Dr. Ladona Ridgel on 05/15/2022.  She then underwent debridement of the right breast wound with wound VAC change on 05/18/2022 with Dr. Ladona Ridgel.  Patient has been following up in the clinic regularly for wound VAC changes.  Patient presents to the clinic today for another wound VAC change.  Patient was last seen in the clinic on 06/12/2022.  At this visit, she reported she was doing well.  The right breast wound was approximately 5.5 cm x 3 cm x 2 cm.  The irritation to the skin appeared to be have been improving.  The wound bed appeared to be healthy.  Black wound sponge wrapped in Adaptic and K-Y jelly was placed over the wound.  Adequate seal was achieved at 125 mmHg.  Plan is for patient to come in in a few days for 1 final wound VAC change prior to her surgery on Friday 06/19/2022.  Today, patient reports she is doing well.  She has no new complaints or concerns.  She denies any fevers or chills.  Chaperone present on exam.  On exam, patient is sitting upright in no acute distress.  Right breast wound VAC sponge and Adaptic was removed.  Wound is approximately 5.5 cm x 2.5 cm x 1.5 cm.  There is no purulence noted on exam.  Wound bed appears to be healthy and well vascularized.  There is a little bit of irritation noted to the skin to the inframammary region.  Otherwise there is no overlying erythema to the skin to the right breast.   Black wound VAC sponge was wrapped in Adaptic and K-Y jelly was applied over the Adaptic this was placed in the wound to the right breast.  Derma tack was placed over the inframammary area.  Wound VAC drapes were placed over the wound VAC was a Lilly pad then placed.  Adequate seal was achieved at 125 mmHg.  I  discussed with the patient that we will take the wound VAC off during surgery on Friday.  I discussed with the patient to call in the meantime if she has any questions or concerns.  Patient expressed understanding.

## 2022-06-17 ENCOUNTER — Telehealth: Payer: Self-pay | Admitting: Plastic Surgery

## 2022-06-17 NOTE — Telephone Encounter (Signed)
Revonda Standard with [employer] called and would like a call back at [phone] ext [phone] regarding a physcians faxed therapy authorization form.  Want to make sure we received it.

## 2022-06-17 NOTE — Progress Notes (Deleted)
Patient ID: Kim Fischer, female    DOB: 08/22/1970, 51 y.o.   MRN: [id number]  No chief complaint on file.   No diagnosis found.   History of Present Illness: Kim Fischer is a 51 y.o.  female  with a history of ***.  She presents for preoperative evaluation for upcoming procedure, ***, scheduled for *** with Dr. {JJHER:[id number]::"KGYJEH","UDJSHFWYOV"}.  The patient {HAS HAS ZCH:[id number]} had problems with anesthesia. ***  Summary of Previous Visit: ***  Job: ***  PMH Significant for: ***   Past Medical History: Allergies: No Known Allergies  Current Medications:  Current Outpatient Medications:    ALBUTEROL IN, Inhale into the lungs., Disp: , Rfl:    ALPRAZolam (XANAX) 1 MG tablet, Take 1 mg by mouth at bedtime., Disp: , Rfl:    Ascorbic Acid (VITAMIN C) 1000 MG tablet, Take 1,000 mg by mouth daily., Disp: , Rfl:    ferrous gluconate (FERGON) 324 MG tablet, Take 324 mg by mouth every morning., Disp: , Rfl:    levothyroxine (SYNTHROID) 100 MCG tablet, Take by mouth., Disp: , Rfl:    metFORMIN (GLUCOPHAGE) 500 MG tablet, Take 500 mg by mouth 2 (two) times daily with a meal., Disp: , Rfl:    ondansetron (ZOFRAN-ODT) 4 MG disintegrating tablet, Take 1 tablet (4 mg total) by mouth every 8 (eight) hours as needed for nausea or vomiting., Disp: 20 tablet, Rfl: 0   oxyCODONE (ROXICODONE) 5 MG immediate release tablet, Take 1 tablet (5 mg total) by mouth every 8 (eight) hours as needed for up to 10 doses for severe pain., Disp: 10 tablet, Rfl: 0   pantoprazole (PROTONIX) 40 MG tablet, Take 40 mg by mouth daily., Disp: , Rfl:    pramipexole (MIRAPEX) 0.25 MG tablet, Take 0.5 mg by mouth at bedtime., Disp: , Rfl:    venlafaxine XR (EFFEXOR-XR) 75 MG 24 hr capsule, Take 75 mg by mouth daily., Disp: , Rfl:   Past Medical Problems: Past Medical History:  Diagnosis Date   Anemia    Depression     Past Surgical History: Past Surgical History:  Procedure Laterality Date    APPLICATION OF WOUND VAC Right 05/15/2022   Procedure: APPLICATION OF WOUND VAC RIGHT BREAST;  Surgeon: Santiago Glad, MD;  Location: [facility] OR;  Service: Plastics;  Laterality: Right;   BREAST BIOPSY Right 2010   negative   BREAST BIOPSY Left 2016   NEG   BREAST EXCISIONAL BIOPSY Right 1997   negative   BREAST REDUCTION SURGERY Bilateral 04/14/2022   Procedure: BILATERAL MAMMARY REDUCTION  (BREAST);  Surgeon: Janne Napoleon, MD;  Location: [facility];  Service: Plastics;  Laterality: Bilateral;   CHOLECYSTECTOMY     DEBRIDEMENT AND CLOSURE WOUND Right 05/15/2022   Procedure: Debridement of necrotic tissue right breast;  Surgeon: Santiago Glad, MD;  Location: [facility] OR;  Service: Plastics;  Laterality: Right;   INCISION AND DRAINAGE OF WOUND Right 05/18/2022   Procedure: DEBRIDEMENT RIGHT BREAST WITH WOUND VAC CHANGE;  Surgeon: Santiago Glad, MD;  Location: [facility] OR;  Service: Plastics;  Laterality: Right;    Social History: Social History   Socioeconomic History   Marital status: Married    Spouse name: Not on file   Number of children: Not on file   Years of education: Not on file   Highest education level: Not on file  Occupational History   Not on file  Tobacco Use   Smoking status: Never  Smokeless tobacco: Never  Vaping Use   Vaping Use: Never used  Substance and Sexual Activity   Alcohol use: Not Currently   Drug use: Never   Sexual activity: Not on file  Other Topics Concern   Not on file  Social History Narrative   Not on file   Social Determinants of Health   Financial Resource Strain: Not on file  Food Insecurity: Not on file  Transportation Needs: Not on file  Physical Activity: Not on file  Stress: Not on file  Social Connections: Not on file  Intimate Partner Violence: Not on file    Family History: Family History  Problem Relation Age of Onset   Breast cancer Mother 41       and again at 78    Review of  Systems: ROS  Physical Exam: Vital Signs LMP 06/15/2022 (Approximate)   Physical Exam *** Constitutional:      General: Not in acute distress.    Appearance: Normal appearance. Not ill-appearing.  HENT:     Head: Normocephalic and atraumatic.  Eyes:     Pupils: Pupils are equal, round Neck:     Musculoskeletal: Normal range of motion.  Cardiovascular:     Rate and Rhythm: Normal rate    Pulses: Normal pulses.  Pulmonary:     Effort: Pulmonary effort is normal. No respiratory distress.  Abdominal:     General: Abdomen is flat. There is no distension.  Musculoskeletal: Normal range of motion.  Skin:    General: Skin is warm and dry.     Findings: No erythema or rash.  Neurological:     General: No focal deficit present.     Mental Status: Alert and oriented to person, place, and time. Mental status is at baseline.     Motor: No weakness.  Psychiatric:        Mood and Affect: Mood normal.        Behavior: Behavior normal.    Assessment/Plan: The patient is scheduled for *** with Dr. {XAJOI:[id number]::"HMCNOB","SJGGEZMOQH"}.  Risks, benefits, and alternatives of procedure discussed, questions answered and consent obtained.    Smoking Status: ***; Counseling Given? *** Last Mammogram: ***; Results: ***  Caprini Score: ***; Risk Factors include: ***, BMI *** 25, and length of planned surgery. Recommendation for mechanical *** prophylaxis. Encourage early ambulation.   Pictures obtained: @consult ***  Post-op Rx sent to pharmacy: {Blank:19197::"Oxycodone, Zofran, Keflex","Oxycodone, Zofran"}  Patient was provided with the *** General Surgical Risk consent document and Pain Medication Agreement prior to their appointment.  They had adequate time to read through the risk consent documents and Pain Medication Agreement. We also discussed them in person together during this preop appointment. All of their questions were answered to their satisfaction.  Recommended calling if they  have any further questions.  Risk consent form and Pain Medication Agreement to be scanned into patient's chart.  The patient gave consent to have this visit done by telemedicine / virtual visit, two identifiers were used to identify patient. This is also consent for access the chart and treat the patient via this visit. The patient is located ***.  I, the provider, am at the office.  We spent *** minutes together for the visit.  Joined by ***.    Electronically signed by: , PA-C 06/17/2022 9:18 AM

## 2022-06-18 ENCOUNTER — Ambulatory Visit (INDEPENDENT_AMBULATORY_CARE_PROVIDER_SITE_OTHER): Payer: 59 | Admitting: Physician Assistant

## 2022-06-18 DIAGNOSIS — Z9889 Other specified postprocedural states: Secondary | ICD-10-CM

## 2022-06-18 MED ORDER — OXYCODONE HCL 5 MG PO TABS: 5.0000 mg | ORAL_TABLET | Freq: Three times a day (TID) | ORAL | 0 refills | Status: DC | PRN

## 2022-06-18 NOTE — Progress Notes (Signed)
Patient ID: Kim Fischer, female    DOB: 08/29/1970, 51 y.o.   MRN: [id number]  No chief complaint on file.   No diagnosis found.   History of Present Illness: Kim Fischer is a 51 y.o.  female  with a history of bilateral breast reduction by Dr. 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 Ladona Ridgel on 05/15/2022.  Wound VAC change on 05/18/2022..  She presents for preoperative evaluation for upcoming procedure, right breast debridement and closure, scheduled for 06/19/2022 with Dr. Ladona Ridgel.   The patient has not had problems with anesthesia.   Summary of Previous Visit: The patient was most recently seen in the office on 06/15/2022.  At that time she had been doing well, she continued to have the wound VAC which was changed, there were no signs of infection.  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   Past Medical History: Allergies: No Known Allergies  Current Medications:  Current Outpatient Medications:    ALBUTEROL IN, Inhale into the lungs., Disp: , Rfl:    ALPRAZolam (XANAX) 1 MG tablet, Take 1 mg by mouth at bedtime., Disp: , Rfl:    Ascorbic Acid (VITAMIN C) 1000 MG tablet, Take 1,000 mg by mouth daily., Disp: , Rfl:    ferrous gluconate (FERGON) 324 MG tablet, Take 324 mg by mouth every morning., Disp: , Rfl:    levothyroxine (SYNTHROID) 100 MCG tablet, Take by mouth., Disp: , Rfl:    metFORMIN (GLUCOPHAGE) 500 MG tablet, Take 500 mg by mouth 2 (two) times daily with a meal., Disp: , Rfl:    ondansetron (ZOFRAN-ODT) 4 MG disintegrating tablet, Take 1 tablet (4 mg total) by mouth every 8 (eight) hours as needed for nausea or vomiting., Disp: 20 tablet, Rfl: 0   oxyCODONE (ROXICODONE) 5 MG immediate release tablet, Take 1 tablet (5 mg total) by mouth every 8 (eight) hours as needed for  up to 10 doses for severe pain., Disp: 10 tablet, Rfl: 0   pantoprazole (PROTONIX) 40 MG tablet, Take 40 mg by mouth daily., Disp: , Rfl:    pramipexole (MIRAPEX) 0.25 MG tablet, Take 0.5 mg by mouth at bedtime., Disp: , Rfl:    venlafaxine XR (EFFEXOR-XR) 75 MG 24 hr capsule, Take 75 mg by mouth daily., Disp: , Rfl:   Past Medical Problems: Past Medical History:  Diagnosis Date   Anemia    Depression     Past Surgical History: Past Surgical History:  Procedure Laterality Date   APPLICATION OF WOUND VAC Right 05/15/2022   Procedure: APPLICATION OF WOUND VAC RIGHT BREAST;  Surgeon: Santiago Glad, MD;  Location: [facility] OR;  Service: Plastics;  Laterality: Right;   BREAST BIOPSY Right 2010   negative   BREAST BIOPSY Left 2016   NEG   BREAST EXCISIONAL BIOPSY Right 1997   negative   BREAST REDUCTION SURGERY Bilateral 04/14/2022   Procedure: BILATERAL MAMMARY REDUCTION  (BREAST);  Surgeon: Janne Napoleon, MD;  Location: [facility];  Service: Plastics;  Laterality: Bilateral;   CHOLECYSTECTOMY     DEBRIDEMENT AND CLOSURE WOUND Right 05/15/2022   Procedure: Debridement of necrotic tissue right breast;  Surgeon: Santiago Glad, MD;  Location: [facility] OR;  Service: Plastics;  Laterality: Right;   INCISION AND DRAINAGE OF WOUND Right 05/18/2022   Procedure: DEBRIDEMENT RIGHT  BREAST WITH WOUND VAC CHANGE;  Surgeon: Santiago Glad, MD;  Location: [facility] OR;  Service: Plastics;  Laterality: Right;    Social History: Social History   Socioeconomic History   Marital status: Married    Spouse name: Not on file   Number of children: Not on file   Years of education: Not on file   Highest education level: Not on file  Occupational History   Not on file  Tobacco Use   Smoking status: Never   Smokeless tobacco: Never  Vaping Use   Vaping Use: Never used  Substance and Sexual Activity   Alcohol use: Not Currently   Drug use: Never   Sexual activity: Not on file  Other Topics  Concern   Not on file  Social History Narrative   Not on file   Social Determinants of Health   Financial Resource Strain: Not on file  Food Insecurity: Not on file  Transportation Needs: Not on file  Physical Activity: Not on file  Stress: Not on file  Social Connections: Not on file  Intimate Partner Violence: Not on file    Family History: Family History  Problem Relation Age of Onset   Breast cancer Mother 49       and again at 48    Review of Systems: ROS Positive for wound    Physical Exam: Vital Signs LMP 06/15/2022 (Approximate)   Physical Exam  Phone encounter Patient speaking in full sentences, no acute distress   Assessment/Plan: The patient is scheduled for right breast debridement and closure with Dr. Ladona Ridgel.  Risks, benefits, and alternatives of procedure discussed, questions answered and consent obtained.    Smoking Status: non smoker; Counseling Given?  Last Mammogram: 01/22/2022; Results: No findings suspicious for malignancy  Caprini Score: 4; Risk Factors include: Age, BMI, length of planned surgery, Recommendation for early ambulation.   Pictures obtained: previous visit   Post-op Rx sent to pharmacy: Oxycodone  We again reviewed the risks and benefits of surgery not limited to bleeding, infection, pain, wound healing, anesthesia risks. The patient had no further questions or concerns moving forward with surgery or any of the risks and benefits.  The patient gave consent to have this visit done by telemedicine / virtual visit, two identifiers were used to identify patient. This is also consent for access the chart and treat the patient via this visit.    Electronically signed by: Kelle Darting Aivan Fillingim, PA-C 06/18/2022 10:17 AM

## 2022-06-19 ENCOUNTER — Encounter (HOSPITAL_BASED_OUTPATIENT_CLINIC_OR_DEPARTMENT_OTHER)
Admission: RE | Disposition: A | Discharge status: Home / Self Care | Payer: Self-pay | Source: Ambulatory Visit | Attending: Plastic Surgery

## 2022-06-19 ENCOUNTER — Ambulatory Visit (HOSPITAL_BASED_OUTPATIENT_CLINIC_OR_DEPARTMENT_OTHER): Payer: 59 | Admitting: Anesthesiology

## 2022-06-19 ENCOUNTER — Encounter (HOSPITAL_BASED_OUTPATIENT_CLINIC_OR_DEPARTMENT_OTHER): Payer: Self-pay | Admitting: Plastic Surgery

## 2022-06-19 ENCOUNTER — Other Ambulatory Visit: Payer: Self-pay

## 2022-06-19 ENCOUNTER — Ambulatory Visit (HOSPITAL_BASED_OUTPATIENT_CLINIC_OR_DEPARTMENT_OTHER)
Admission: RE | Admit: 2022-06-19 | Discharge: 2022-06-19 | Disposition: A | Discharge status: Home / Self Care | Payer: 59 | Source: Ambulatory Visit | Attending: Plastic Surgery | Admitting: Plastic Surgery

## 2022-06-19 DIAGNOSIS — K219 Gastro-esophageal reflux disease without esophagitis: Secondary | ICD-10-CM | POA: Insufficient documentation

## 2022-06-19 DIAGNOSIS — I96 Gangrene, not elsewhere classified: Secondary | ICD-10-CM | POA: Insufficient documentation

## 2022-06-19 DIAGNOSIS — L7682 Other postprocedural complications of skin and subcutaneous tissue: Secondary | ICD-10-CM | POA: Insufficient documentation

## 2022-06-19 DIAGNOSIS — E1152 Type 2 diabetes mellitus with diabetic peripheral angiopathy with gangrene: Secondary | ICD-10-CM | POA: Diagnosis not present

## 2022-06-19 DIAGNOSIS — Z7984 Long term (current) use of oral hypoglycemic drugs: Secondary | ICD-10-CM | POA: Diagnosis not present

## 2022-06-19 DIAGNOSIS — T8131XA Disruption of external operation (surgical) wound, not elsewhere classified, initial encounter: Secondary | ICD-10-CM

## 2022-06-19 DIAGNOSIS — E669 Obesity, unspecified: Secondary | ICD-10-CM | POA: Insufficient documentation

## 2022-06-19 DIAGNOSIS — F419 Anxiety disorder, unspecified: Secondary | ICD-10-CM | POA: Insufficient documentation

## 2022-06-19 DIAGNOSIS — E039 Hypothyroidism, unspecified: Secondary | ICD-10-CM | POA: Diagnosis not present

## 2022-06-19 DIAGNOSIS — F32A Depression, unspecified: Secondary | ICD-10-CM | POA: Insufficient documentation

## 2022-06-19 DIAGNOSIS — E119 Type 2 diabetes mellitus without complications: Secondary | ICD-10-CM | POA: Diagnosis not present

## 2022-06-19 DIAGNOSIS — Z6838 Body mass index (BMI) 38.0-38.9, adult: Secondary | ICD-10-CM | POA: Insufficient documentation

## 2022-06-19 DIAGNOSIS — J449 Chronic obstructive pulmonary disease, unspecified: Secondary | ICD-10-CM | POA: Insufficient documentation

## 2022-06-19 DIAGNOSIS — Z01818 Encounter for other preprocedural examination: Secondary | ICD-10-CM

## 2022-06-19 HISTORY — PX: DEBRIDEMENT AND CLOSURE WOUND: SHX5614

## 2022-06-19 LAB — POCT PREGNANCY, URINE: Preg Test, Ur: NEGATIVE

## 2022-06-19 SURGERY — DEBRIDEMENT, WOUND, WITH CLOSURE
Anesthesia: General | Site: Breast | Laterality: Right

## 2022-06-19 MED ORDER — OXYCODONE HCL 5 MG PO TABS
ORAL_TABLET | ORAL | Status: AC
  Filled 2022-06-19: qty 1

## 2022-06-19 MED ORDER — HEMOSTATIC AGENTS (NO CHARGE) OPTIME
TOPICAL | Status: DC | PRN
  Administered 2022-06-19: 1 via TOPICAL

## 2022-06-19 MED ORDER — AMISULPRIDE (ANTIEMETIC) 5 MG/2ML IV SOLN
5.0000 mg | Freq: Once | INTRAVENOUS | Status: AC
  Administered 2022-06-19: 5 mg via INTRAVENOUS

## 2022-06-19 MED ORDER — CHLORHEXIDINE GLUCONATE CLOTH 2 % EX PADS: 6.0000 | MEDICATED_PAD | Freq: Once | CUTANEOUS | Status: DC

## 2022-06-19 MED ORDER — DEXAMETHASONE SODIUM PHOSPHATE 10 MG/ML IJ SOLN
INTRAMUSCULAR | Status: AC
  Filled 2022-06-19: qty 1

## 2022-06-19 MED ORDER — MEPERIDINE HCL 25 MG/ML IJ SOLN: 6.2500 mg | INTRAMUSCULAR | Status: DC | PRN

## 2022-06-19 MED ORDER — SCOPOLAMINE 1 MG/3DAYS TD PT72
1.0000 | MEDICATED_PATCH | TRANSDERMAL | Status: DC
  Administered 2022-06-19: 1.5 mg via TRANSDERMAL

## 2022-06-19 MED ORDER — HYDROMORPHONE HCL 1 MG/ML IJ SOLN
INTRAMUSCULAR | Status: AC
  Filled 2022-06-19: qty 0.5

## 2022-06-19 MED ORDER — LACTATED RINGERS IV SOLN: INTRAVENOUS | Status: DC

## 2022-06-19 MED ORDER — PROMETHAZINE HCL 25 MG/ML IJ SOLN: 6.2500 mg | INTRAMUSCULAR | Status: DC | PRN

## 2022-06-19 MED ORDER — EPHEDRINE 5 MG/ML INJ
INTRAVENOUS | Status: AC
  Filled 2022-06-19: qty 5

## 2022-06-19 MED ORDER — LIDOCAINE 2% (20 MG/ML) 5 ML SYRINGE
INTRAMUSCULAR | Status: AC
  Filled 2022-06-19: qty 5

## 2022-06-19 MED ORDER — SULFAMETHOXAZOLE-TRIMETHOPRIM 800-160 MG PO TABS: 2.0000 | ORAL_TABLET | Freq: Two times a day (BID) | ORAL | 0 refills | Status: AC

## 2022-06-19 MED ORDER — 0.9 % SODIUM CHLORIDE (POUR BTL) OPTIME
TOPICAL | Status: DC | PRN
  Administered 2022-06-19: 1000 mL

## 2022-06-19 MED ORDER — FENTANYL CITRATE (PF) 100 MCG/2ML IJ SOLN
INTRAMUSCULAR | Status: DC | PRN
  Administered 2022-06-19: 25 ug via INTRAVENOUS
  Administered 2022-06-19: 100 ug via INTRAVENOUS
  Administered 2022-06-19 (×3): 25 ug via INTRAVENOUS

## 2022-06-19 MED ORDER — ACETAMINOPHEN 500 MG PO TABS
ORAL_TABLET | ORAL | Status: AC
  Filled 2022-06-19: qty 2

## 2022-06-19 MED ORDER — ONDANSETRON HCL 4 MG/2ML IJ SOLN
INTRAMUSCULAR | Status: AC
  Filled 2022-06-19: qty 2

## 2022-06-19 MED ORDER — SUCCINYLCHOLINE CHLORIDE 200 MG/10ML IV SOSY
PREFILLED_SYRINGE | INTRAVENOUS | Status: AC
  Filled 2022-06-19: qty 10

## 2022-06-19 MED ORDER — MIDAZOLAM HCL 5 MG/5ML IJ SOLN
INTRAMUSCULAR | Status: DC | PRN
  Administered 2022-06-19: 2 mg via INTRAVENOUS

## 2022-06-19 MED ORDER — SCOPOLAMINE 1 MG/3DAYS TD PT72
MEDICATED_PATCH | TRANSDERMAL | Status: AC
  Filled 2022-06-19: qty 1

## 2022-06-19 MED ORDER — DEXAMETHASONE SODIUM PHOSPHATE 4 MG/ML IJ SOLN
INTRAMUSCULAR | Status: DC | PRN
  Administered 2022-06-19: 5 mg via INTRAVENOUS

## 2022-06-19 MED ORDER — FENTANYL CITRATE (PF) 100 MCG/2ML IJ SOLN
INTRAMUSCULAR | Status: AC
  Filled 2022-06-19: qty 2

## 2022-06-19 MED ORDER — ACETAMINOPHEN 500 MG PO TABS
1000.0000 mg | ORAL_TABLET | Freq: Once | ORAL | Status: AC
  Administered 2022-06-19: 1000 mg via ORAL

## 2022-06-19 MED ORDER — PHENYLEPHRINE 80 MCG/ML (10ML) SYRINGE FOR IV PUSH (FOR BLOOD PRESSURE SUPPORT)
PREFILLED_SYRINGE | INTRAVENOUS | Status: AC
  Filled 2022-06-19: qty 10

## 2022-06-19 MED ORDER — HYDROMORPHONE HCL 1 MG/ML IJ SOLN
0.2500 mg | INTRAMUSCULAR | Status: DC | PRN
  Administered 2022-06-19 (×3): 0.5 mg via INTRAVENOUS

## 2022-06-19 MED ORDER — CEFAZOLIN SODIUM-DEXTROSE 2-4 GM/100ML-% IV SOLN
2.0000 g | INTRAVENOUS | Status: AC
  Administered 2022-06-19: 2 g via INTRAVENOUS

## 2022-06-19 MED ORDER — AMISULPRIDE (ANTIEMETIC) 5 MG/2ML IV SOLN
INTRAVENOUS | Status: AC
  Filled 2022-06-19: qty 2

## 2022-06-19 MED ORDER — LIDOCAINE HCL (CARDIAC) PF 100 MG/5ML IV SOSY
PREFILLED_SYRINGE | INTRAVENOUS | Status: DC | PRN
  Administered 2022-06-19: 60 mg via INTRAVENOUS

## 2022-06-19 MED ORDER — CEFAZOLIN SODIUM-DEXTROSE 2-4 GM/100ML-% IV SOLN
INTRAVENOUS | Status: AC
  Filled 2022-06-19: qty 100

## 2022-06-19 MED ORDER — OXYCODONE HCL 5 MG/5ML PO SOLN: 5.0000 mg | Freq: Once | ORAL | Status: AC | PRN

## 2022-06-19 MED ORDER — MIDAZOLAM HCL 2 MG/2ML IJ SOLN
INTRAMUSCULAR | Status: AC
  Filled 2022-06-19: qty 2

## 2022-06-19 MED ORDER — OXYCODONE HCL 5 MG PO TABS
5.0000 mg | ORAL_TABLET | Freq: Once | ORAL | Status: AC | PRN
  Administered 2022-06-19: 5 mg via ORAL

## 2022-06-19 MED ORDER — MIDAZOLAM HCL 2 MG/2ML IJ SOLN: 0.5000 mg | Freq: Once | INTRAMUSCULAR | Status: DC | PRN

## 2022-06-19 MED ORDER — PROPOFOL 10 MG/ML IV BOLUS
INTRAVENOUS | Status: DC | PRN
  Administered 2022-06-19: 200 mg via INTRAVENOUS

## 2022-06-19 SURGICAL SUPPLY — 63 items
ADH SKN CLS APL DERMABOND .7 (GAUZE/BANDAGES/DRESSINGS) ×1
APL PRP STRL LF DISP 70% ISPRP (MISCELLANEOUS)
BAG DECANTER FOR FLEXI CONT (MISCELLANEOUS) ×1 IMPLANT
BINDER BREAST 3XL (GAUZE/BANDAGES/DRESSINGS) IMPLANT
BINDER BREAST LRG (GAUZE/BANDAGES/DRESSINGS) IMPLANT
BINDER BREAST MEDIUM (GAUZE/BANDAGES/DRESSINGS) IMPLANT
BINDER BREAST XLRG (GAUZE/BANDAGES/DRESSINGS) IMPLANT
BINDER BREAST XXLRG (GAUZE/BANDAGES/DRESSINGS) IMPLANT
BLADE SURG 10 STRL SS (BLADE) ×4 IMPLANT
BLADE SURG 15 STRL LF DISP TIS (BLADE) ×1 IMPLANT
BLADE SURG 15 STRL SS (BLADE) ×1
CANISTER SUCT 1200ML W/VALVE (MISCELLANEOUS) ×1 IMPLANT
CHLORAPREP W/TINT 26 (MISCELLANEOUS) ×2 IMPLANT
COVER BACK TABLE 60X90IN (DRAPES) ×1 IMPLANT
COVER MAYO STAND STRL (DRAPES) ×1 IMPLANT
DERMABOND ADVANCED .7 DNX12 (GAUZE/BANDAGES/DRESSINGS) ×4 IMPLANT
DRAIN CHANNEL 19F RND (DRAIN) IMPLANT
DRAPE LAPAROSCOPIC ABDOMINAL (DRAPES) IMPLANT
DRAPE UTILITY XL STRL (DRAPES) ×1 IMPLANT
DRSG TEGADERM 2-3/8X2-3/4 SM (GAUZE/BANDAGES/DRESSINGS) ×2 IMPLANT
ELECT REM PT RETURN 9FT ADLT (ELECTROSURGICAL) ×1
ELECTRODE REM PT RTRN 9FT ADLT (ELECTROSURGICAL) ×2 IMPLANT
EVACUATOR SILICONE 100CC (DRAIN) IMPLANT
GAUZE PAD ABD 8X10 STRL (GAUZE/BANDAGES/DRESSINGS) ×2 IMPLANT
GLOVE BIO SURGEON STRL SZ7.5 (GLOVE) ×1 IMPLANT
GLOVE BIO SURGEON STRL SZ8 (GLOVE) ×2 IMPLANT
GLOVE BIOGEL M STRL SZ7.5 (GLOVE) ×1 IMPLANT
GLOVE BIOGEL PI IND STRL 8 (GLOVE) ×2 IMPLANT
GOWN STRL REUS W/ TWL LRG LVL3 (GOWN DISPOSABLE) ×1 IMPLANT
GOWN STRL REUS W/TWL LRG LVL3 (GOWN DISPOSABLE) ×2
GOWN STRL REUS W/TWL XL LVL3 (GOWN DISPOSABLE) ×2 IMPLANT
HEMOSTAT ARISTA ABSORB 3G PWDR (HEMOSTASIS) IMPLANT
MARKER SKIN DUAL TIP RULER LAB (MISCELLANEOUS) IMPLANT
NDL HYPO 25X1 1.5 SAFETY (NEEDLE) IMPLANT
NDL SAFETY ECLIP 18X1.5 (MISCELLANEOUS) IMPLANT
NEEDLE HYPO 25X1 1.5 SAFETY (NEEDLE) IMPLANT
NS IRRIG 1000ML POUR BTL (IV SOLUTION) ×1 IMPLANT
PACK BASIN DAY SURGERY FS (CUSTOM PROCEDURE TRAY) ×1 IMPLANT
PACK UNIVERSAL I (CUSTOM PROCEDURE TRAY) IMPLANT
PENCIL SMOKE EVACUATOR (MISCELLANEOUS) ×2 IMPLANT
PIN SAFETY STERILE (MISCELLANEOUS) IMPLANT
SHEET MEDIUM DRAPE 40X70 STRL (DRAPES) IMPLANT
SLEEVE SCD COMPRESS KNEE MED (STOCKING) ×1 IMPLANT
SPIKE FLUID TRANSFER (MISCELLANEOUS) IMPLANT
SPONGE GAUZE 2X2 8PLY STRL LF (GAUZE/BANDAGES/DRESSINGS) ×2 IMPLANT
SPONGE T-LAP 18X18 ~~LOC~~+RFID (SPONGE) ×3 IMPLANT
STAPLER INSORB 30 2030 C-SECTI (MISCELLANEOUS) IMPLANT
STAPLER VISISTAT 35W (STAPLE) ×1 IMPLANT
SUT MNCRL AB 3-0 PS2 27 (SUTURE) ×6 IMPLANT
SUT MNCRL AB 4-0 PS2 18 (SUTURE) ×2 IMPLANT
SUT MON AB 2-0 CT1 36 (SUTURE) IMPLANT
SUT MON AB 5-0 PS2 18 (SUTURE) IMPLANT
SUT PROLENE 3 0 PS 2 (SUTURE) IMPLANT
SUT SILK 2 0 SH (SUTURE) ×2 IMPLANT
SUT VIC AB 3-0 PS1 18 (SUTURE)
SUT VIC AB 3-0 PS1 18XBRD (SUTURE) ×2 IMPLANT
SYR BULB IRRIG 60ML STRL (SYRINGE) ×1 IMPLANT
SYR CONTROL 10ML LL (SYRINGE) IMPLANT
TAPE MEASURE VINYL STERILE (MISCELLANEOUS) IMPLANT
TOWEL GREEN STERILE FF (TOWEL DISPOSABLE) ×2 IMPLANT
TUBE CONNECTING 20X1/4 (TUBING) ×1 IMPLANT
UNDERPAD 30X36 HEAVY ABSORB (UNDERPADS AND DIAPERS) ×2 IMPLANT
YANKAUER SUCT BULB TIP NO VENT (SUCTIONS) ×1 IMPLANT

## 2022-06-19 NOTE — Interval H&P Note (Signed)
History and Physical Interval Note: Met Ms Rothlisberger and her husband in the pro op holding area. No change in physical exam, no change in indication for surgery.  Will proceed with wound preparation and possible closure at her request. 06/19/2022 1:37 PM  Halford Chessman  has presented today for surgery, with the diagnosis of Right breast wound, s/p breast reduction.  The various methods of treatment have been discussed with the patient and family. After consideration of risks, benefits and other options for treatment, the patient has consented to  Procedure(s): Right breast debridement and closure. (Right) as a surgical intervention.  The patient's history has been reviewed, patient examined, no change in status, stable for surgery.  I have reviewed the patient's chart and labs.  Questions were answered to the patient's satisfaction.     [name]

## 2022-06-19 NOTE — Anesthesia Preprocedure Evaluation (Addendum)
Anesthesia Evaluation  Patient identified by MRN, date of birth, ID band Patient awake    Reviewed: Allergy & Precautions, NPO status , Patient's Chart, lab work & pertinent test results  History of Anesthesia Complications Negative for: history of anesthetic complications  Airway Mallampati: II  TM Distance: >3 FB Neck ROM: Full    Dental  (+) Dental Advisory Given, Teeth Intact   Pulmonary asthma , COPD,  COPD inhaler   breath sounds clear to auscultation       Cardiovascular negative cardio ROS  Rhythm:Regular Rate:Normal     Neuro/Psych   Anxiety Depression    negative neurological ROS     GI/Hepatic Neg liver ROS,GERD  Medicated and Controlled,,  Endo/Other  diabetes, Oral Hypoglycemic AgentsHypothyroidism  Morbid obesityBMI 37.5  Renal/GU negative Renal ROS     Musculoskeletal   Abdominal  (+) + obese  Peds  Hematology negative hematology ROS (+)   Anesthesia Other Findings   Reproductive/Obstetrics                             Anesthesia Physical Anesthesia Plan  ASA: 3  Anesthesia Plan: General   Post-op Pain Management: Tylenol PO (pre-op)*   Induction: Intravenous  PONV Risk Score and Plan: 3 and Ondansetron, Dexamethasone and Scopolamine patch - Pre-op  Airway Management Planned: LMA  Additional Equipment:   Intra-op Plan:   Post-operative Plan:   Informed Consent: I have reviewed the patients History and Physical, chart, labs and discussed the procedure including the risks, benefits and alternatives for the proposed anesthesia with the patient or authorized representative who has indicated his/her understanding and acceptance.     Dental advisory given  Plan Discussed with: CRNA and Surgeon  Anesthesia Plan Comments:        Anesthesia Quick Evaluation

## 2022-06-19 NOTE — Anesthesia Procedure Notes (Addendum)
Procedure Name: LMA Insertion Date/Time: 06/19/2022 2:00 PM  Performed by: Ronnette Hila, CRNAPre-anesthesia Checklist: Patient identified, Emergency Drugs available, Suction available and Patient being monitored Patient Re-evaluated:Patient Re-evaluated prior to induction Oxygen Delivery Method: Circle system utilized Preoxygenation: Pre-oxygenation with 100% oxygen Induction Type: IV induction Ventilation: Mask ventilation without difficulty LMA: LMA inserted LMA Size: 4.0 Number of attempts: 1 Airway Equipment and Method: Bite block Placement Confirmation: positive ETCO2 Tube secured with: Tape Dental Injury: Teeth and Oropharynx as per pre-operative assessment

## 2022-06-19 NOTE — Transfer of Care (Signed)
Immediate Anesthesia Transfer of Care Note  Patient: Kim Fischer  Procedure(s) Performed: Right breast debridement and nineteen centimeter width closure. (Right: Breast)  Patient Location: PACU  Anesthesia Type:General  Level of Consciousness: awake, alert , oriented, drowsy, and patient cooperative  Airway & Oxygen Therapy: Patient Spontanous Breathing and Patient connected to face mask oxygen  Post-op Assessment: Report given to RN and Post -op Vital signs reviewed and stable  Post vital signs: Reviewed and stable  Last Vitals:  Vitals Value Taken Time  BP 148/79 06/19/22 1548  Temp    Pulse 90 06/19/22 1548  Resp 16 06/19/22 1548  SpO2 98 % 06/19/22 1548  Vitals shown include unvalidated device data.  Last Pain:  Vitals:   06/19/22 1214  TempSrc: Oral  PainSc: 0-No pain         Complications: No notable events documented.

## 2022-06-19 NOTE — Op Note (Signed)
DATE OF OPERATION: 06/19/2022  LOCATION: [facility] operating Room  PREOPERATIVE DIAGNOSIS: Open wound, status post surgical complication following breast reduction.  POSTOPERATIVE DIAGNOSIS: Same  PROCEDURE: Right breast wound debridement with primary closure, 18 cm total length  SURGEON: Loren Racer, MD  ASSISTANT: Caroline More  EBL: 150 cc  CONDITION: Stable  COMPLICATIONS: None  INDICATION: The patient, Kim Fischer, is a 51 y.o. female born on 05-Dec-1970, is here for treatment open right breast wound.Marland Kitchen   PROCEDURE DETAILS:  The patient was seen prior to surgery and marked.  IV antibiotics were given. The patient was taken to the operating room and given a general anesthetic. A standard time out was performed and all information was confirmed by those in the room. SCDs were placed.   The right chest and breast were prepped and draped in usual sterile manner.  Previously placed sutures were removed.  On evaluation of the wound there was no evidence of ischemia there was no evidence of ongoing infection.  I elected to elevate the lateral portion of the breast and debride the entire breast and close it primarily.  The wound edges were excised sharply down to healthy dermis and healthy fat.  All granulation tissue was excised sharply and with the electrocautery.  Because of the amount of inflammation from the use of the wound VAC and the previous infection there was a significant amount of tissue oozing.  Significant bleeding vessels were cauterized and Arista was placed in the wound to assist with remainder of hemostasis.  3 deep sutures were placed in the breast parenchyma to close the dead space.  The dermis was then approximated with interrupted 3-0 Monocryl sutures and the skin was closed with a running 4-0 Monocryl suture.  The T point was reinforced with interrupted 3-0 Prolene sutures.  All incisions were sealed with Dermabond.  The vertical incision measured 11 cm in the  horizontal incision measured 8 cm. The patient was allowed to wake up and taken to recovery room in stable condition at the end of the case. The family was notified at the end of the case.  All instrument needle and sponge counts were reported as correct.  Last tissue excised was sent to pathology for routine examination.  The advanced practice practitioner (APP), Ms Caroline More, assisted throughout the case.  The APP was essential in retraction and counter traction when needed to make the case progress smoothly.  This retraction and assistance made it possible to see the tissue plans for the procedure.  The assistance was needed for blood control, tissue re-approximation and assisted with closure of the incision site.

## 2022-06-19 NOTE — Discharge Instructions (Addendum)
INSTRUCTIONS FOR AFTER BREAST SURGERY   You will likely have some questions about what to expect following your operation.  The following information will help you and your family understand what to expect when you are discharged from the hospital.  It is important to follow these guidelines to help ensure a smooth recovery and reduce complication.  Postoperative instructions include information on: diet, wound care, medications and physical activity.  AFTER SURGERY Expect to go home after the procedure.  In some cases, you may need to spend one night in the hospital for observation.  DIET Breast surgery does not require a specific diet.  However, the healthier you eat the better your body will heal. It is important to increasing your protein intake.  This means limiting the foods with sugar and carbohydrates.  Focus on vegetables and some meat.  If you have liposuction during your procedure be sure to drink water.  If your urine is bright yellow, then it is concentrated, and you need to drink more water.  As a general rule after surgery, you should have 8 ounces of water every hour while awake.  If you find you are persistently nauseated or unable to take in liquids let us know.  NO TOBACCO USE or EXPOSURE.  This will slow your healing process and lead to a wound.  WOUND CARE You may shower in 48 hours. Use fragrance-free soap such as Dove, [place] or Dial. Allow water and soap to run over the surgical site. Do not scrub. Pat dry after shower.  No baths, pools or hot tubs You may reinforce the surgical site with ABD pads and change as needed  Continue to wear breast binder or sports bra   ACTIVITY No heavy lifting until cleared by the doctor.  This usually means no more than a half-gallon of milk.  It is OK to walk and climb stairs. Moving your legs is very important to decrease your risk of a blood clot.  It will also help keep you from getting deconditioned.  Every 1 to 2 hours get up and walk for  5 minutes. This will help with a quicker recovery back to normal.  Let pain be your guide so you don't do too much.  This time is for you to recover.  You will be more comfortable if you sleep and rest with your head elevated either with a few pillows under you or in a recliner.  No stomach sleeping for a three months.  WORK Everyone returns to work at different times. As a rough guide, most people take at least 1 - 2 weeks off prior to returning to work. If you need documentation for your job, give the forms to the front staff at the clinic.  DRIVING Arrange for someone to bring you home from the hospital after your surgery.  You may be able to drive a few days after surgery but not while taking any narcotics or valium.  BOWEL MOVEMENTS Constipation can occur after anesthesia and while taking pain medication.  It is important to stay ahead for your comfort.  We recommend taking Milk of Magnesia (2 tablespoons; twice a day) while taking the pain pills.  MEDICATIONS You may be prescribed should start after surgery At your preoperative visit for you history and physical you may have been given the following medications: An antibiotic (Bactrim): Start this medication when you get home and take according to the instructions on the bottle. Zofran 4 mg:  This is to treat nausea  and vomiting.  You can take this every 6 hours as needed and only if needed. Oxycodone 5 mg:  This is only to be used after you have taken the Motrin or the Tylenol. Every 8 hours as needed.  TOOK OXYCODONE AT 4:40PM   Over the counter Medication to take: Ibuprofen (Motrin) 600 mg:  Take this every 6 hours.  If you have additional pain then take 500 mg of the Tylenol every 8 hours.  Only take the Norco after you have tried these two. MiraLAX or Milk of Magnesia: Take this according to the bottle if you take the Norco.  WHEN TO CALL Call your surgeon's office if any of the following occur: Fever 101 degrees F or  greater Excessive bleeding or fluid from the incision site. Pain that increases over time without aid from the medications Redness, warmth, or pus draining from incision sites Persistent nausea or inability to take in liquids Severe misshapen area that underwent the operation.  Here are some resources for breast cancer patients:  Plastic surgery website: https://www.plasticsurgery.org/for-medical-professionals/education-and-resources/publications/breast-reconstruction-magazine Breast Reconstruction Awareness Campaign:  [employer]ss[employer]fr Plastic surgery Implant information:  https://www.plasticsurgery.org/patient-safety/breast-implant-safety    You had had 1000mg  Tylenol at 12:15pm today. You may have more after 6:15pm today, if needed.  Post Anesthesia Home Care Instructions  Activity: Get plenty of rest for the remainder of the day. A responsible individual must stay with you for 24 hours following the procedure.  For the next 24 hours, DO NOT: -Drive a car - -Drink alcoholic beverages -Take any medication unless instructed by your physician -Make any legal decisions or sign important papers.  Meals: Start with liquid foods such as gelatin or soup. Progress to regular foods as tolerated. Avoid greasy, spicy, heavy foods. If nausea and/or vomiting occur, drink only clear liquids until the nausea and/or vomiting subsides. Call your physician if vomiting continues.  Special Instructions/Symptoms: Your throat may feel dry or sore from the anesthesia or the breathing tube placed in your throat during surgery. If this causes discomfort, gargle with warm salt water. The discomfort should disappear within 24 hours.  If you had a scopolamine patch placed behind your ear for the management of post- operative nausea and/or vomiting:  1. The medication in the patch is effective for 72 hours, after which it should be removed.  Wrap patch in a tissue and discard in  the trash. Wash hands thoroughly with soap and water. 2. You may remove the patch earlier than 72 hours if you experience unpleasant side effects which may include dry mouth, dizziness or visual disturbances. 3. Avoid touching the patch. Wash your hands with soap and water after contact with the patch.

## 2022-06-19 NOTE — Anesthesia Postprocedure Evaluation (Signed)
Anesthesia Post Note  Patient: Kim Fischer  Procedure(s) Performed: Right breast debridement and nineteen centimeter width closure. (Right: Breast)     Patient location during evaluation: PACU Anesthesia Type: General Level of consciousness: awake and alert Pain management: pain level controlled Vital Signs Assessment: post-procedure vital signs reviewed and stable Respiratory status: spontaneous breathing, nonlabored ventilation and respiratory function stable Cardiovascular status: blood pressure returned to baseline and stable Postop Assessment: no apparent nausea or vomiting and adequate PO intake Anesthetic complications: no   No notable events documented.  Last Vitals:  Vitals:   06/19/22 1558 06/19/22 1615  BP: (!) 148/81 137/74  Pulse: 94 89  Resp: 10 11  Temp: 36.8 C   SpO2: 98% 95%    Last Pain:  Vitals:   06/19/22 1615  TempSrc:   PainSc: 6                  Vibhav Waddill,E. Jamesen Stahnke

## 2022-06-20 ENCOUNTER — Encounter (HOSPITAL_BASED_OUTPATIENT_CLINIC_OR_DEPARTMENT_OTHER): Payer: Self-pay | Admitting: Plastic Surgery

## 2022-06-22 ENCOUNTER — Telehealth: Payer: Self-pay

## 2022-06-22 LAB — SURGICAL PATHOLOGY

## 2022-06-22 NOTE — Telephone Encounter (Signed)
I called the patient and spoke to her regarding her concerns.  Patient states that on Saturday, she reached backward and felt that a stitch popped.  She states that since then, her husband reports that there is a small opening at the inferior T-zone.  She states that there is also been some bloody appearing drainage since Saturday as well.  She states that the drainage drips, and she changes gauze a few times per day.  Patient reports she has been applying Vaseline and gauze to the area.  Patient reports her breast is a little swollen.  She denies any bruising over her breast.  Patient denies any fevers or chills.  She denies any lightheadedness or dizziness.  I discussed with the patient that I would like her to be evaluated in the clinic tomorrow.  I discussed with the patient that if she has any worsening symptoms, she needs to call us back or she needs to be evaluated in the emergency room.  Patient expressed understanding.  I discussed with the patient that she should continue compression at all times.  I also recommended that the patient place an ABD pad or maxi pad over the area for drainage.  Patient expressed understanding.  Patient to come in tomorrow to the clinic.

## 2022-06-22 NOTE — Telephone Encounter (Signed)
Pt called c/o a little bleeding from "the R inverted mammary fold" per patient. She stated she reached behind her this past Saturday on the right side to get something from the back seat and she thinks a stitch popped. She admits bleeding started Saturday but has been applying vaseline and gauze to the area and that it's not stopping with just pressure. She stated the bleeding is not bad but just wanted to report it to make sure she didn't need to do something else. She reports everything else is fine.   I have to leave early due to low census. Please advise Angie if you need a CMA to assist.   Thanks!

## 2022-06-23 ENCOUNTER — Encounter: Payer: Self-pay | Admitting: Student

## 2022-06-23 ENCOUNTER — Ambulatory Visit (INDEPENDENT_AMBULATORY_CARE_PROVIDER_SITE_OTHER): Payer: 59 | Admitting: Student

## 2022-06-23 VITALS — BP 124/88 | HR 98 | Temp 97.6°F

## 2022-06-23 DIAGNOSIS — T8131XD Disruption of external operation (surgical) wound, not elsewhere classified, subsequent encounter: Secondary | ICD-10-CM

## 2022-06-23 MED ORDER — DOXYCYCLINE HYCLATE 100 MG PO TABS: 100.0000 mg | ORAL_TABLET | Freq: Two times a day (BID) | ORAL | 0 refills | Status: AC

## 2022-06-23 NOTE — Progress Notes (Signed)
Patient is a 51 year old female who underwent bilateral breast reduction with Dr. Domenica Reamer on 04/14/2022.  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  Patient later went to the operating room for debridement of the necrotic tissue with Dr. Ladona Ridgel on 05/15/2022.  She then underwent debridement of the right breast wound with wound VAC change on 05/18/2022 with Dr. Ladona Ridgel.  Patient most recently underwent right breast wound debridement with primary closure on 06/19/2022 with Dr. Ladona Ridgel.     Patient presents to the clinic today with concerns about drainage from her right breast.  She is accompanied by her husband at bedside.  Patient states that the drainage has lightened up a little bit since we spoke on the phone yesterday.  She reports that she is still changing the dressings every few hours.  She reports the dressing is not completely saturated, but she has been checking the surgical site and changing the dressing at that time.  Patient reports she had been putting Vaseline on the area.  Patient denies any fevers or chills.  She denies any dizziness or lightheadedness.  She denies any significant pain at the surgical site.  Patient reports she is not taking oxycodone since postop day 1.  Patient states she has been taking Bactrim and that she has her last dose of it today.  She states that the Bactrim has caused some mild GI upset.  Vital signs were obtained at today's visit.  Vitals are stable and patient is afebrile. Vitals:   06/23/22 1357  BP: 124/88  Pulse: 98  Temp: 97.6 F (36.4 C)  SpO2: 97%     Chaperone present on exam.  On exam, patient is sitting upright in no acute distress.  There is some swelling noted to the right breast.  There is some mild ecchymosis near the incision, and some mild irritation noted to the skin to the lateral/inferior aspect of the breast.  The breast is soft.  There are no significant fluid collections palpated on exam.   Incision appears to be intact.  Prolene sutures to the inferior T-zone appear to be intact.  There is some drainage noted coming from the inferior T-zone.  The drainage appears to be serosanguineous.  There are no significant open wounds visualized on exam.   I discussed with the patient that the drainage that she is experiencing appears to be serosanguineous.  I discussed with the patient that she should apply ABD pads to the area as needed for drainage.  I discussed with the patient that she should discontinue the Vaseline for now and allow the area to drain.  I discussed with the patient that she should wear compression at all times.  Patient expressed understanding.  I discussed with the patient that we will extend her antibiotics for another 5 days.  I discussed with her that we will switch her to doxycycline.  Patient was in agreement with this.  She states she has taken doxycycline before and has done well with it.  I instructed the patient to continue to monitor the area closely.  I discussed with the patient that if the area becomes more swollen, becomes tighter, becomes more red, if her drainage significantly increases, if she feels lightheaded, dizzy, or develops any fevers, chills or any worsening symptoms, she needs to let us know immediately or be evaluated in the emergency room.  Patient expressed understanding.  Patient to return to the clinic in 2 days for reevaluation.  I instructed the patient  to call if she has any questions or concerns in the meantime.  Pictures were obtained of the patient and placed in the chart with the patient's or guardian's permission.  I discussed the objective findings and plan with Dr. Ladona Ridgel.

## 2022-06-25 ENCOUNTER — Ambulatory Visit (INDEPENDENT_AMBULATORY_CARE_PROVIDER_SITE_OTHER): Payer: 59 | Admitting: Plastic Surgery

## 2022-06-25 ENCOUNTER — Encounter: Payer: Self-pay | Admitting: Plastic Surgery

## 2022-06-25 VITALS — BP 131/66 | HR 103

## 2022-06-25 DIAGNOSIS — T8131XD Disruption of external operation (surgical) wound, not elsewhere classified, subsequent encounter: Secondary | ICD-10-CM

## 2022-06-25 NOTE — Progress Notes (Signed)
Kim Fischer returns today for evaluation after closure of her breast wounds 6 days ago.  She is doing quite well without any complaints of fever, chills, nausea.  She states that the drainage from her breast is significantly decreased over the past 24 to 48 hours.  She denies any pain in the right breast.  On exam the incisions are healing well.  There is no erythema of the breast and there is no pain on palpation.  Status post revision and closure of the right breast after nipple loss following breast reduction.  She will continue to monitor the breast over the next few days.  She has an appointment scheduled for December 28 however I have instructed her to cancel this appointment if she is doing well and follow-up with me in the middle portion of January.  He is to call the clinic immediately if she has any concerns.

## 2022-07-09 ENCOUNTER — Encounter: Payer: 59 | Admitting: Student

## 2022-07-15 ENCOUNTER — Ambulatory Visit (INDEPENDENT_AMBULATORY_CARE_PROVIDER_SITE_OTHER): Payer: 59 | Admitting: Plastic Surgery

## 2022-07-15 VITALS — BP 114/74 | HR 85 | Temp 97.6°F | Resp 18

## 2022-07-15 DIAGNOSIS — T8131XD Disruption of external operation (surgical) wound, not elsewhere classified, subsequent encounter: Secondary | ICD-10-CM

## 2022-07-15 DIAGNOSIS — Z9889 Other specified postprocedural states: Secondary | ICD-10-CM

## 2022-07-15 NOTE — Progress Notes (Signed)
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  She is doing well.  It is that she still has a small opening at the T point of the right breast.  She denies any pain fevers chills.  She still has a small amount of drainage from the wound.  On examination there is still 3 sutures in place.  She has a 1 x 1 cm opening that when probed extends approximately 5 cm into the breast.  There is no erythema and there is no drainage.  Will continue conservative wound care.  We did discuss the importance of a healthy diet with good quality lean protein and vegetables high in vitamin C and zinc.  She will return for follow-up in 2 weeks at her scheduled appointment.

## 2022-07-22 ENCOUNTER — Encounter (HOSPITAL_BASED_OUTPATIENT_CLINIC_OR_DEPARTMENT_OTHER): Payer: Self-pay | Admitting: Plastic Surgery

## 2022-07-27 ENCOUNTER — Telehealth: Payer: Self-pay | Admitting: *Deleted

## 2022-07-27 NOTE — Telephone Encounter (Addendum)
Received on (07/04/22) via of fax Request for Discharge Orders from [facility].  Requesting signature and dates for the discharge orders.  Given to provider to sign and date.    Discharge order signed,dated, and faxed back to [facility].  Confirmation received and copy scanned into the chart.//AB/CMA

## 2022-07-29 ENCOUNTER — Encounter: Payer: Self-pay | Admitting: Plastic Surgery

## 2022-07-29 ENCOUNTER — Ambulatory Visit (INDEPENDENT_AMBULATORY_CARE_PROVIDER_SITE_OTHER): Payer: 59 | Admitting: Plastic Surgery

## 2022-07-29 VITALS — BP 121/83 | HR 87

## 2022-07-29 DIAGNOSIS — S21001D Unspecified open wound of right breast, subsequent encounter: Secondary | ICD-10-CM

## 2022-07-29 NOTE — Progress Notes (Signed)
Kim Fischer returns today for postop evaluation of her right breast.  She has no specific complaints today and notes that she has minimal drainage on her dressings.  The breast is normal in appearance with no erythema.  1 cm wound at the T-junction.  Probing this wound with a Q-tip there is still a small cavity however this feels smaller to me than the last time I evaluated her.  We discussed several options today including the possibility of returning to the operating room opening up the cavity and washing it out and placing aSmall amount of myriad to speed wound healing.  Amenable to this however she has several activities going on right now which she would prefer not to have interrupted.  I think this is fine and have encouraged her to return to see me in February after all of the activities are completed and she is ready to schedule the procedure.  Follow-up with me in early February.

## 2022-08-26 NOTE — Progress Notes (Unsigned)
Patient is a 52 year old female who underwent bilateral breast reduction with Dr. Erin Hearing on 04/15/2023.  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  Patient later went to the operating room for debridement of the necrotic tissue with Dr. Lovena Le on 05/15/2022.  She then underwent debridement of the right breast wound with wound VAC change on 05/18/2022.  Patient most recently underwent right breast wound debridement with primary closure on 06/19/2022 with Dr. Lovena Le.  Patient was most recently seen in the clinic on 07/29/2022.  At this visit, she had no new complaints and reported that she had minimal drainage on her dressings.  On exam, the breast had normal appearance with no erythema.  There is a 1 cm wound at the T junction.  When the wound was probed with a Q-tip, there is a small cavity, however it felt improved from the previous visit.  It was discussed with the patient that there could be the possibility of returning to the operating room to open up the small cavity, wash it out and place a small amount of myriad to help speed up the wound healing process.  Patient was amenable to this, but she had several activities going on which she preferred not to have interrupted.  Plan was for patient to follow-up in early February.  Today,

## 2022-08-27 ENCOUNTER — Ambulatory Visit (INDEPENDENT_AMBULATORY_CARE_PROVIDER_SITE_OTHER): Payer: 59 | Admitting: Student

## 2022-08-27 DIAGNOSIS — S21001D Unspecified open wound of right breast, subsequent encounter: Secondary | ICD-10-CM

## 2022-11-25 ENCOUNTER — Ambulatory Visit: Payer: 59 | Admitting: Student

## 2022-12-28 ENCOUNTER — Encounter: Payer: Self-pay | Admitting: Plastic Surgery

## 2022-12-28 ENCOUNTER — Ambulatory Visit: Payer: 59 | Admitting: Plastic Surgery

## 2022-12-28 DIAGNOSIS — Z9889 Other specified postprocedural states: Secondary | ICD-10-CM

## 2022-12-28 DIAGNOSIS — N6489 Other specified disorders of breast: Secondary | ICD-10-CM | POA: Diagnosis not present

## 2022-12-28 NOTE — Progress Notes (Signed)
Kim Fischer returns today approximately 8-1/2 months after a bilateral breast reduction.  Her breast reduction was complicated by necrosis of the nipple and a portion of the pedicle on the right side.  She has undergone multiple debridements and wound management.  Her incisions are all well-healed now but there is a significant asymmetry between the right and left breast.  She would like to have a balancing procedure done.  We discussed this at length and I believe that her best option would be fat grafting to achieve symmetry.  We discussed this procedure at length including where I anticipate taking the fat.  She has axillary fat which could be harvested as well as anterior abdominal wall fat which could be used for fat transfer.  We did discuss the fact that this is often a two-stage procedure as some of the fat that is grafted will be lost before the blood supply can be reestablished.  She also understands that there is a risk of infection and seroma formation especially at the harvest sites.  Would like to proceed.  All questions were answered to her satisfaction.  Photographs were obtained today with her consent.  Will schedule at her request.

## 2023-01-19 IMAGING — MG MM DIGITAL SCREENING BILAT W/ TOMO AND CAD
6 of 12 series · 6 of 36 positions shown · non-contrast
Comparison: Previous exam(s).

ACR Breast Density Category a: The breast tissue is almost entirely
fatty.

CLINICAL DATA: Screening.

EXAM:
DIGITAL SCREENING BILATERAL MAMMOGRAM WITH TOMOSYNTHESIS AND CAD
TECHNIQUE: Bilateral screening digital craniocaudal and mediolateral oblique
mammograms were obtained. Bilateral screening digital breast
tomosynthesis was performed. The images were evaluated with
computer-aided detection.

[L MLO synth-2D]
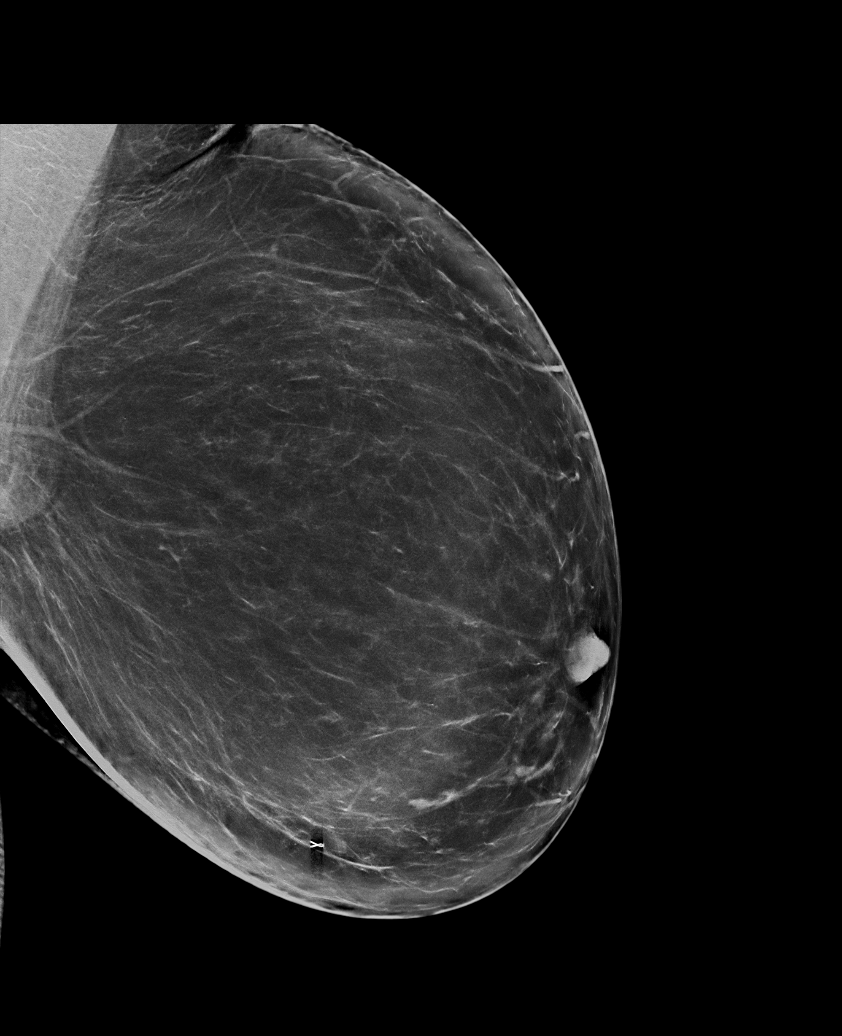

[R CC synth-2D]
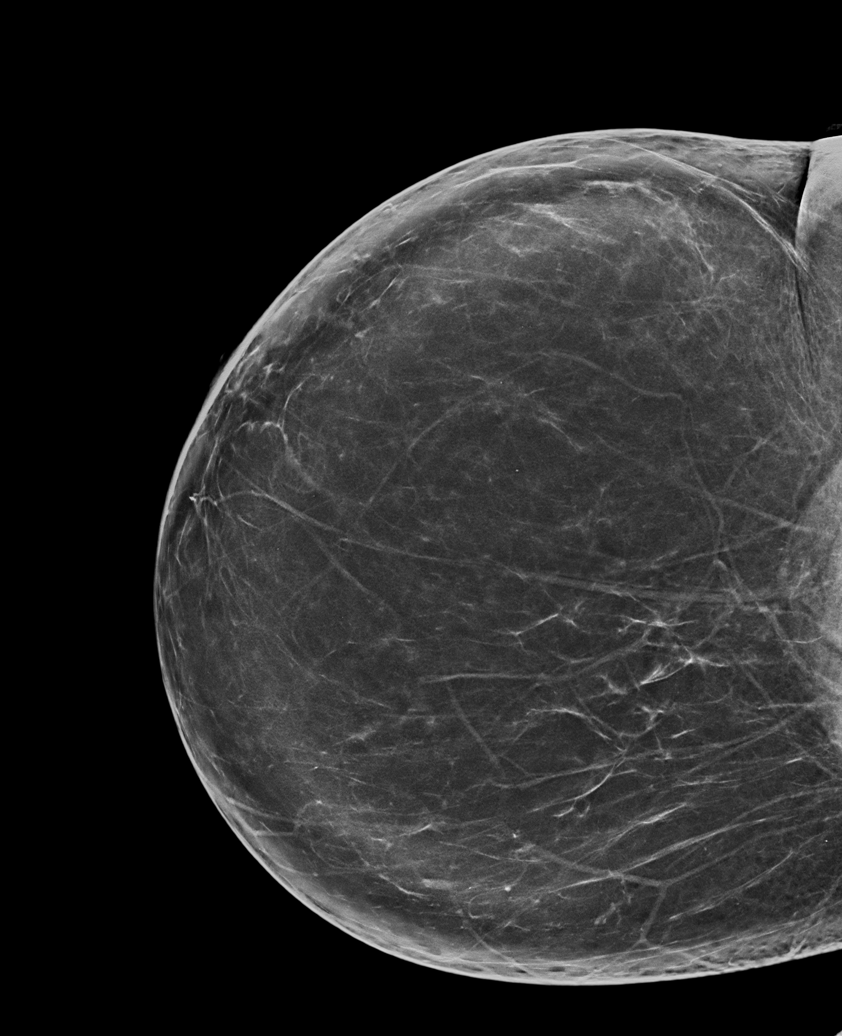

[R MLO synth-2D (1 of 2)]
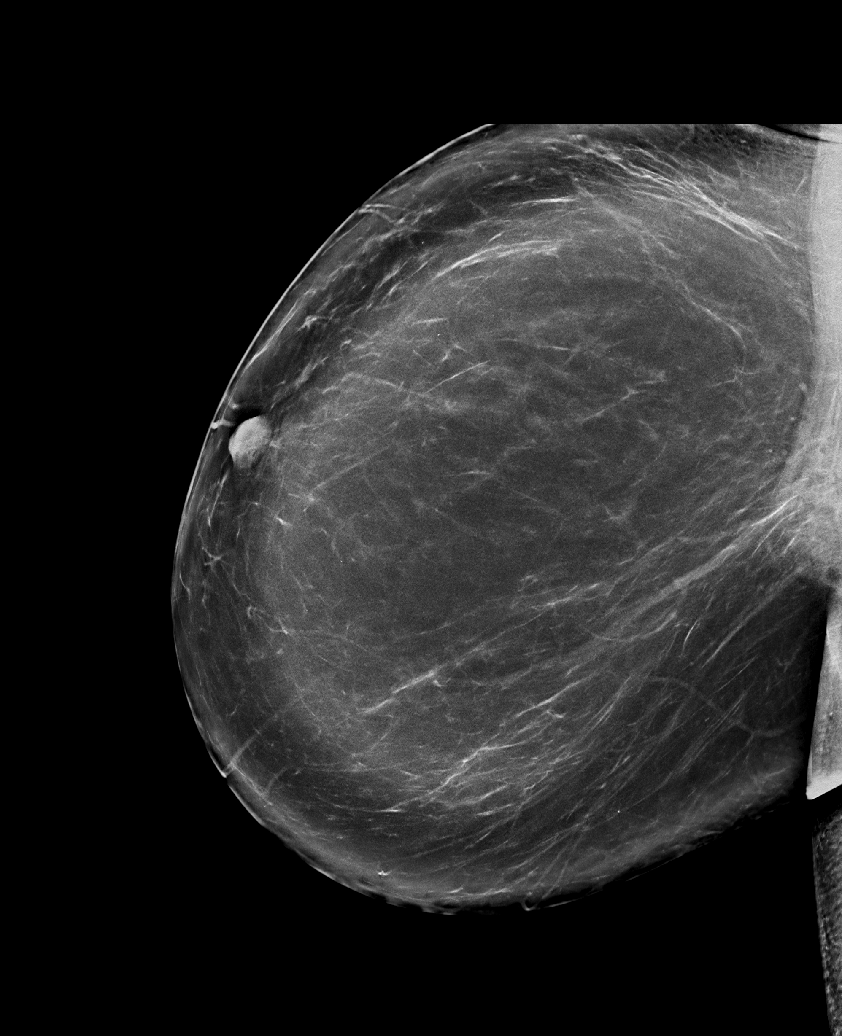

[R XCCL synth-2D]
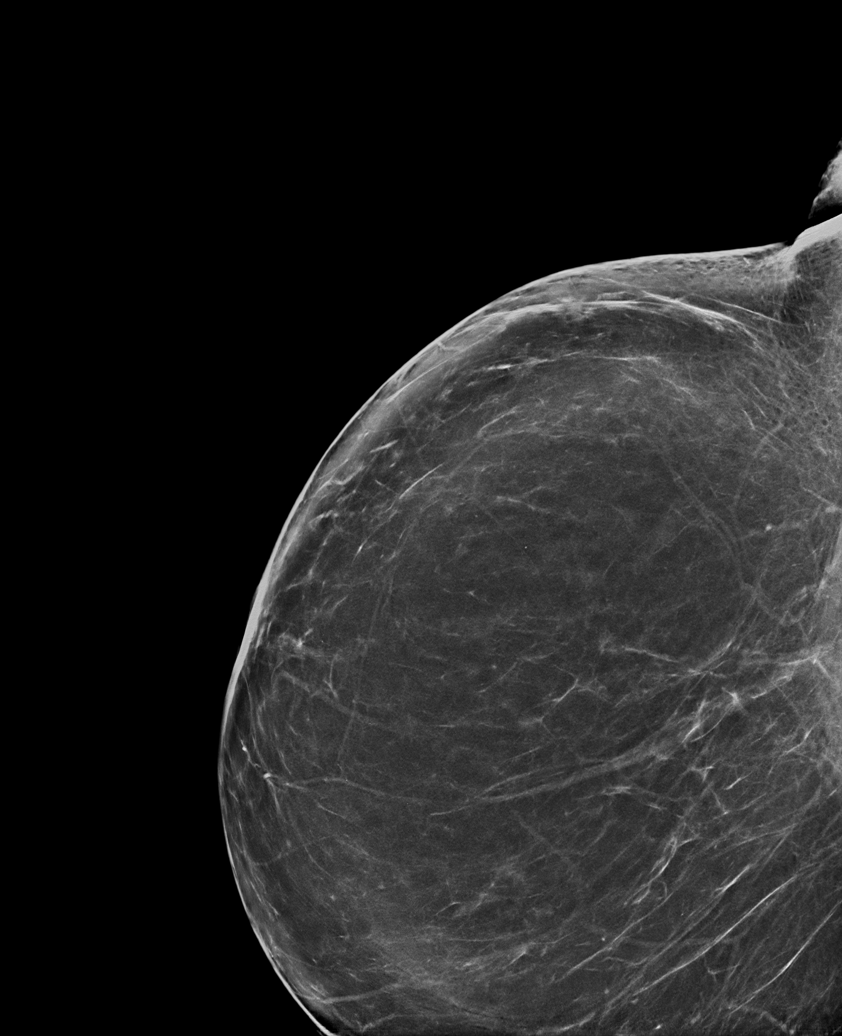

[R MLO synth-2D (2 of 2)]
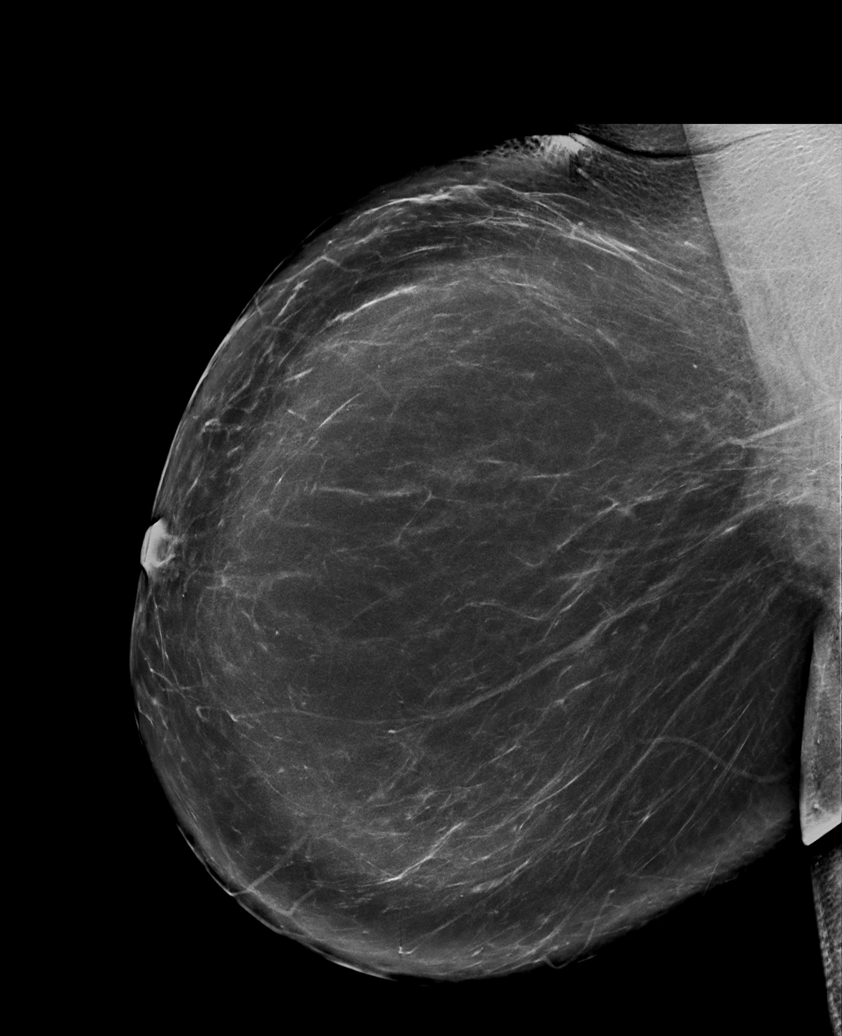

[L CC synth-2D]
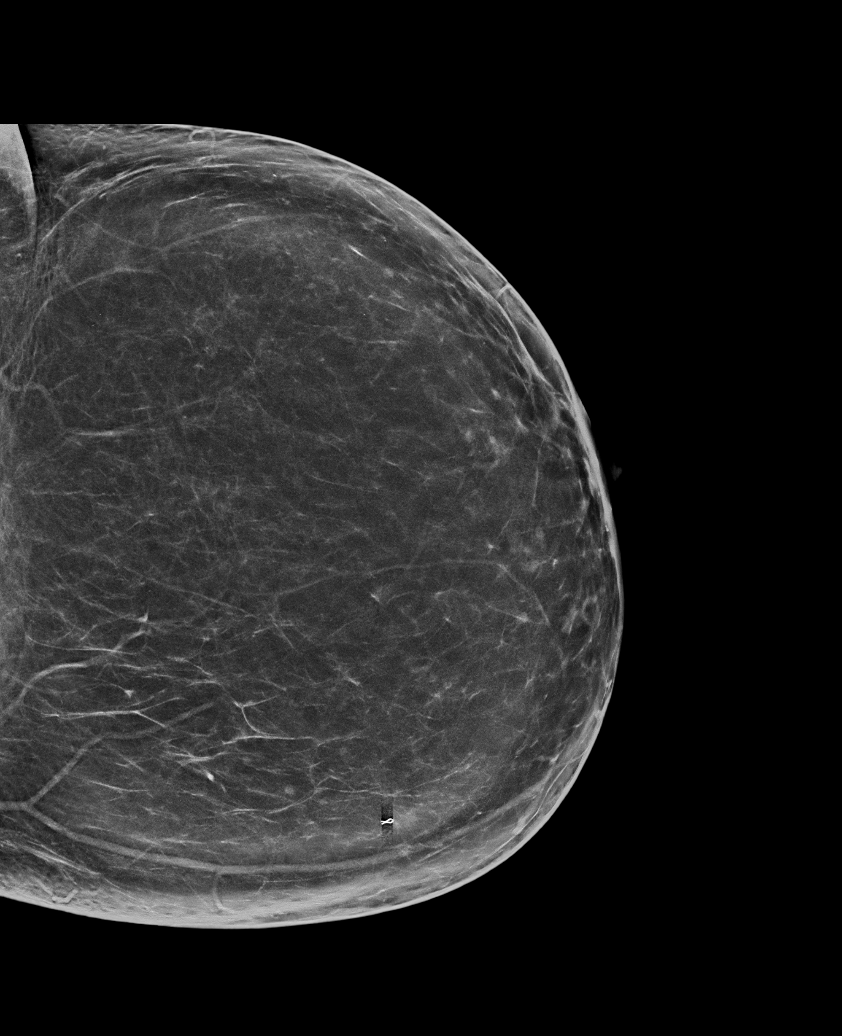

[6 of 36 positions shown; findings below may reference images not displayed]

FINDINGS: There are no findings suspicious for malignancy. The images were
evaluated with computer-aided detection.
IMPRESSION: No mammographic evidence of malignancy. A result letter of this
screening mammogram will be mailed directly to the patient.

RECOMMENDATION:
Screening mammogram in one year. (Code:[id number])

BI-RADS CATEGORY  1: Negative.

## 2023-02-12 ENCOUNTER — Telehealth: Payer: Self-pay | Admitting: Plastic Surgery

## 2023-02-12 NOTE — Telephone Encounter (Signed)
Patient called and was just wanting to get an update on where she is on getting her surgery. She said she does understand that there are ones in front of her and that there might be more urgent ones so she wasn't in a hurry but was just curious. Please advise

## 2023-10-12 ENCOUNTER — Other Ambulatory Visit: Payer: Self-pay | Admitting: Internal Medicine

## 2023-10-12 DIAGNOSIS — Z1231 Encounter for screening mammogram for malignant neoplasm of breast: Secondary | ICD-10-CM

## 2023-10-22 ENCOUNTER — Ambulatory Visit
Admission: RE | Admit: 2023-10-22 | Discharge: 2023-10-22 | Disposition: A | Discharge status: Home / Self Care | Source: Ambulatory Visit | Attending: Internal Medicine | Admitting: Internal Medicine

## 2023-10-22 ENCOUNTER — Encounter: Payer: Self-pay | Admitting: Plastic Surgery

## 2023-10-22 DIAGNOSIS — Z1231 Encounter for screening mammogram for malignant neoplasm of breast: Secondary | ICD-10-CM | POA: Diagnosis present

## 2023-10-25 ENCOUNTER — Ambulatory Visit: Admitting: Plastic Surgery

## 2023-10-25 ENCOUNTER — Encounter: Payer: Self-pay | Admitting: Plastic Surgery

## 2023-10-25 VITALS — BP 119/67 | HR 100

## 2023-10-25 DIAGNOSIS — N6489 Other specified disorders of breast: Secondary | ICD-10-CM | POA: Diagnosis not present

## 2023-10-25 DIAGNOSIS — T8131XD Disruption of external operation (surgical) wound, not elsewhere classified, subsequent encounter: Secondary | ICD-10-CM

## 2023-10-25 DIAGNOSIS — S21001S Unspecified open wound of right breast, sequela: Secondary | ICD-10-CM

## 2023-10-25 NOTE — Progress Notes (Signed)
 Kim Fischer is a very pleasant 53 year old female who returns today for discussion of filling the contour defect from her breast reduction complication.  In late 2023 she underwent a very large bilateral breast reduction.  This was complicated by tissue loss as well as nipple loss on the right side.  She required extensive wound care including debridement and use of a wound VAC.  She has healed her incisions nicely but has a significant contour defect on the lower pole of the right breast.  She would like to have this contour defect repaired if possible.  On examination the incisions are clean dry and intact and well-healed.  When she raises her right arm there is a very large defect at the lower pole at the vertical incision.  There is also a breast asymmetry of greater than 1 cup size between the 2 breasts.  I believe she is a good candidate for fat grafting to improve the contour defect and achieve slightly better symmetry.  We discussed the technique of fat grafting including harvesting fat from the axillas and from the anterior abdominal wall.  We discussed the risks of bleeding and infection.  She also understands that there is not always a 100% take of the fat and that occasionally multiple rounds of fat grafting are required.  She would like to proceed if her insurance will cover this.  Photographs were obtained today with her consent.  Questions were answered to her satisfaction.  Will submit her for insurance consideration of fat grafting to the right breast at her request.  30 minutes were spent reviewing chart, examining patient, counseling patient, coordinating care, and documenting.

## 2023-11-08 ENCOUNTER — Encounter: Payer: Self-pay | Admitting: Obstetrics & Gynecology

## 2023-11-08 ENCOUNTER — Ambulatory Visit: Admitting: Obstetrics & Gynecology

## 2023-11-08 ENCOUNTER — Other Ambulatory Visit (HOSPITAL_COMMUNITY)
Admission: RE | Admit: 2023-11-08 | Discharge: 2023-11-08 | Disposition: A | Discharge status: Home / Self Care | Source: Ambulatory Visit | Attending: Obstetrics & Gynecology | Admitting: Obstetrics & Gynecology

## 2023-11-08 VITALS — BP 136/85 | HR 82 | Ht 62.0 in | Wt 183.1 lb

## 2023-11-08 DIAGNOSIS — Z01419 Encounter for gynecological examination (general) (routine) without abnormal findings: Secondary | ICD-10-CM | POA: Insufficient documentation

## 2023-11-08 DIAGNOSIS — Z124 Encounter for screening for malignant neoplasm of cervix: Secondary | ICD-10-CM

## 2023-11-08 DIAGNOSIS — Z803 Family history of malignant neoplasm of breast: Secondary | ICD-10-CM | POA: Diagnosis not present

## 2023-11-08 DIAGNOSIS — N926 Irregular menstruation, unspecified: Secondary | ICD-10-CM

## 2023-11-08 DIAGNOSIS — N852 Hypertrophy of uterus: Secondary | ICD-10-CM

## 2023-11-08 DIAGNOSIS — R32 Unspecified urinary incontinence: Secondary | ICD-10-CM

## 2023-11-08 NOTE — Progress Notes (Signed)
 GYNECOLOGY ANNUAL PHYSICAL EXAM PROGRESS NOTE  Subjective:    Kim Fischer is a married 53 y.o. P3 who presents for an annual exam.  She is new to this practice. The patient is sexually active. The patient participates in regular exercise: yes. Has the patient ever been transfused or tattooed?: no. The patient reports that there is not domestic violence in her life.   The patient has the following complaints today: She has urinary leaking, wears liners daily. Periods have become irregular, may skip a couple of months and then have periods q 3 weeks.  Menstrual History: Patient's last menstrual period was 11/05/2023 (exact date).    Her husband had a vasectomy.  Gynecologic History:  Contraception: vasectomy History of STI's:  Last Pap: 2016.  Colonoscopy in 2023 (due 5 years after that). Mammogram this month, s/p breast reduction surgery.  FH- + breast cancer in her mom, no gyn or colon cancer       OB History  Gravida Para Term Preterm AB Living  3 0 0 0 0 2  SAB IAB Ectopic Multiple Live Births  0 0 0 0 2    # Outcome Date GA Lbr Len/2nd Weight Sex Type Anes PTL Lv  3 Gravida 07/12/99     Vag-Spont   LIV  2 Gravida 07/13/96     Vag-Spont   LIV  1 Gravida 11/06/93     Vag-Spont  Y     Past Medical History:  Diagnosis Date   Anemia    Depression     Past Surgical History:  Procedure Laterality Date   APPLICATION OF WOUND VAC Right 05/15/2022   Procedure: APPLICATION OF WOUND VAC RIGHT BREAST;  Surgeon: Teretha Ferguson, MD;  Location: [facility] OR;  Service: Plastics;  Laterality: Right;   BREAST BIOPSY Right 2010   negative   BREAST BIOPSY Left 2016   NEG   BREAST EXCISIONAL BIOPSY Right 1997   negative   BREAST REDUCTION SURGERY Bilateral 04/14/2022   Procedure: BILATERAL MAMMARY REDUCTION  (BREAST);  Surgeon: Rodman Clam, MD;  Location: [facility];  Service: Plastics;  Laterality: Bilateral;   CHOLECYSTECTOMY     DEBRIDEMENT AND  CLOSURE WOUND Right 05/15/2022   Procedure: Debridement of necrotic tissue right breast;  Surgeon: Teretha Ferguson, MD;  Location: [facility] OR;  Service: Plastics;  Laterality: Right;   DEBRIDEMENT AND CLOSURE WOUND Right 06/19/2022   Procedure: Right breast debridement and nineteen centimeter width closure.;  Surgeon: Teretha Ferguson, MD;  Location: [facility];  Service: Plastics;  Laterality: Right;   INCISION AND DRAINAGE OF WOUND Right 05/18/2022   Procedure: DEBRIDEMENT RIGHT BREAST WITH WOUND VAC CHANGE;  Surgeon: Teretha Ferguson, MD;  Location: [facility] OR;  Service: Plastics;  Laterality: Right;   REDUCTION MAMMAPLASTY Bilateral 04/2022    Family History  Problem Relation Age of Onset   Breast cancer Mother 24       and again at 11    Social History   Socioeconomic History   Marital status: Married    Spouse name: Not on file   Number of children: Not on file   Years of education: Not on file   Highest education level: Not on file  Occupational History   Not on file  Tobacco Use   Smoking status: Never   Smokeless tobacco: Never  Vaping Use   Vaping status: Never Used  Substance and Sexual Activity   Alcohol use: Not Currently  Drug use: Never   Sexual activity: Yes    Comment: Spouse- surgical  Other Topics Concern   Not on file  Social History Narrative   Not on file   Social Drivers of Health   Financial Resource Strain: Not on file  Food Insecurity: Not on file  Transportation Needs: Not on file  Physical Activity: Not on file  Stress: Not on file  Social Connections: Not on file  Intimate Partner Violence: Not on file    Current Outpatient Medications on File Prior to Visit  Medication Sig Dispense Refill   ALPRAZolam  (XANAX ) 1 MG tablet Take 1 mg by mouth at bedtime.     levothyroxine (SYNTHROID) 100 MCG tablet Take by mouth.     MOUNJARO 2.5 MG/0.5ML Pen Inject 2.5 mg into the skin once a week.     pantoprazole  (PROTONIX ) 40 MG tablet  Take 40 mg by mouth daily.     ALBUTEROL IN Inhale into the lungs.     Ascorbic Acid (VITAMIN C) 1000 MG tablet Take 1,000 mg by mouth daily.     ferrous gluconate (FERGON) 324 MG tablet Take 324 mg by mouth every morning.     pramipexole  (MIRAPEX ) 0.25 MG tablet Take 0.5 mg by mouth at bedtime.     venlafaxine  XR (EFFEXOR -XR) 75 MG 24 hr capsule Take 75 mg by mouth daily.     No current facility-administered medications on file prior to visit.    No Known Allergies   Review of Systems Constitutional: negative for chills, fatigue, fevers and sweats Eyes: negative for irritation, redness and visual disturbance Ears, nose, mouth, throat, and face: negative for hearing loss, nasal congestion, snoring and tinnitus Respiratory: negative for asthma, cough, sputum Cardiovascular: negative for chest pain, dyspnea, exertional chest pressure/discomfort, irregular heart beat, palpitations and syncope Gastrointestinal: negative for abdominal pain, change in bowel habits, nausea and vomiting Genitourinary: negative for abnormal menstrual periods, genital lesions, sexual problems and vaginal discharge, dysuria and urinary incontinence Integument/breast: negative for breast lump, breast tenderness and nipple discharge Hematologic/lymphatic: negative for bleeding and easy bruising Musculoskeletal:negative for back pain and muscle weakness Neurological: negative for dizziness, headaches, vertigo and weakness Endocrine: negative for diabetic symptoms including polydipsia, polyuria and skin dryness Allergic/Immunologic: negative for hay fever and urticaria      Objective:  Blood pressure 136/85, pulse 82, height 5\' 2"  (1.575 m), weight 183 lb 1.6 oz (83.1 kg), last menstrual period 11/05/2023. Body mass index is 33.49 kg/m.    General Appearance:    Alert, cooperative, no distress, appears stated age  Head:    Normocephalic, without obvious abnormality, atraumatic  Eyes:    PERRL,  conjunctiva/corneas clear, EOM's intact, both eyes  Ears:    Normal external ear canals, both ears  Nose:   Nares normal, septum midline, mucosa normal, no drainage or sinus tenderness  Throat:   Lips, mucosa, and tongue normal; teeth and gums normal  Neck:   Supple, symmetrical, trachea midline, no adenopathy; thyroid: no enlargement/tenderness/nodules; no carotid bruit or JVD  Back:     Symmetric, no curvature, ROM normal, no CVA tenderness  Lungs:     Clear to auscultation bilaterally, respirations unlabored  Chest Wall:    No tenderness or deformity   Heart:    Regular rate and rhythm, S1 and S2 normal, no murmur, rub or gallop  Breast Exam:    No tenderness, masses, or nipple abnormality  Abdomen:     Soft, non-tender, bowel sounds active all four quadrants,  no masses, no organomegaly.    Genitalia:    Pelvic:external genitalia normal, vagina without lesions, discharge, or tenderness, moderate amount of bleeding from cervical os, bimanual reveals a 12 week size mobile non-tender uterus  Rectal:    Normal external sphincter.  No hemorrhoids appreciated. Internal exam not done.   Extremities:   Extremities normal, atraumatic, no cyanosis or edema  Pulses:   2+ and symmetric all extremities  Skin:   Skin color, texture, turgor normal, no rashes or lesions  Lymph nodes:   Cervical, supraclavicular, and axillary nodes normal  Neurologic:   CNII-XII intact, normal strength, sensation and reflexes throughout   .  Labs:  Lab Results  Component Value Date   WBC 5.1 05/15/2022   HGB 11.3 (L) 05/15/2022   HCT 34.7 (L) 05/15/2022   MCV 78.0 (L) 05/15/2022   PLT 227 05/15/2022       Assessment:   1. Well woman exam with routine gynecological exam    2.      Irregular periods, anemia- schedule pelvic pt  3.      Urinary incontinence- start pelvic PT, refer to urology prn  4.      Mother s/p breast cancer- check genetics  Plan:   As above  She will follow up after ultrasound  results are available  Ana Balling, MD [facility]

## 2023-11-11 DIAGNOSIS — Z803 Family history of malignant neoplasm of breast: Secondary | ICD-10-CM

## 2023-11-11 DIAGNOSIS — Z1371 Encounter for nonprocreative screening for genetic disease carrier status: Secondary | ICD-10-CM

## 2023-11-11 DIAGNOSIS — Z9189 Other specified personal risk factors, not elsewhere classified: Secondary | ICD-10-CM

## 2023-11-11 HISTORY — DX: Family history of malignant neoplasm of breast: Z80.3

## 2023-11-11 HISTORY — DX: Encounter for nonprocreative screening for genetic disease carrier status: Z13.71

## 2023-11-11 HISTORY — DX: Other specified personal risk factors, not elsewhere classified: Z91.89

## 2023-11-11 LAB — CYTOLOGY - PAP
Comment: NEGATIVE
Diagnosis: UNDETERMINED — AB
High risk HPV: NEGATIVE

## 2023-11-12 ENCOUNTER — Ambulatory Visit
Admission: RE | Admit: 2023-11-12 | Discharge: 2023-11-12 | Disposition: A | Discharge status: Home / Self Care | Source: Ambulatory Visit | Attending: Obstetrics & Gynecology | Admitting: Obstetrics & Gynecology

## 2023-11-12 DIAGNOSIS — N926 Irregular menstruation, unspecified: Secondary | ICD-10-CM | POA: Diagnosis present

## 2023-11-12 DIAGNOSIS — N852 Hypertrophy of uterus: Secondary | ICD-10-CM | POA: Insufficient documentation

## 2023-11-13 ENCOUNTER — Encounter: Payer: Self-pay | Admitting: Obstetrics & Gynecology

## 2023-11-30 ENCOUNTER — Encounter: Payer: Self-pay | Admitting: Obstetrics and Gynecology

## 2023-12-01 ENCOUNTER — Ambulatory Visit (INDEPENDENT_AMBULATORY_CARE_PROVIDER_SITE_OTHER): Admitting: Physician Assistant

## 2023-12-01 VITALS — BP 106/63 | HR 98 | Ht 62.0 in | Wt 178.8 lb

## 2023-12-01 DIAGNOSIS — N6489 Other specified disorders of breast: Secondary | ICD-10-CM

## 2023-12-01 MED ORDER — OXYCODONE HCL 5 MG PO TABS: 5.0000 mg | ORAL_TABLET | Freq: Three times a day (TID) | ORAL | 0 refills | Status: AC | PRN

## 2023-12-01 MED ORDER — ONDANSETRON 4 MG PO TBDP: 4.0000 mg | ORAL_TABLET | Freq: Three times a day (TID) | ORAL | 0 refills | Status: AC | PRN

## 2023-12-01 NOTE — Progress Notes (Signed)
 Patient ID: LILLYN WIECZOREK, female    DOB: 21-Feb-1971, 53 y.o.   MRN: [id number]  Chief Complaint  Patient presents with   Pre-op Exam      ICD-10-CM   1. Breast asymmetry in female  N64.89        History of Present Illness: EYONNA SANDSTROM is a 53 y.o.  female  with a history of breast asymmetry.  She presents for preoperative evaluation for upcoming procedure, abdominal liposuction with revision of right breast scar and fat grafting to right breast, scheduled for 12/24/2023 with Dr. Carolynne Citron.  The patient has not had problems with anesthesia.  She denies any personal or family history of blood clots or clotting disorder.  She denies any personal history of severe cardiac or pulmonary disease, nicotine use, or inflammatory bowel disease.  She does have a single varicosity.  She will hold her Mounjaro at least 7 days prior to surgery.  She may continue with her other prescription medications.  Discussed risks of surgery including but not limited to postoperative bleeding, seroma, and discomfort at the liposuction sites.  She understands and is agreeable to proceed.  Summary of Previous Visit: Patient had a bilateral breast reduction 04/2022 that was complicated by right areolar necrosis.  Debridement of the necrotic breast tissue was performed 05/15/2022 by Dr. Carolynne Citron.  She subsequently received a wound VAC.  She had additional debridement with primary closure shortly thereafter 06/2022.  She then met with Dr. Carolynne Citron for consult 10/2023 for breast asymmetry.  Discussed fat grafting to help improve the contour of the right breast and to achieve slightly improved symmetry.  Discussed the possibility of harvesting fat from axillas and anterior abdominal wall as well as the associated risks.  Patient was understanding and agreeable to the plan.  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  PMH Significant for: Breast asymmetry subsequent to bilateral breast reduction complicated by right NAC  necrosis, thyroid disorder, anxiety, GERD.   Past Medical History: Allergies: No Known Allergies  Current Medications:  Current Outpatient Medications:    ALPRAZolam  (XANAX ) 1 MG tablet, Take 1 mg by mouth at bedtime., Disp: , Rfl:    levothyroxine (SYNTHROID) 100 MCG tablet, Take by mouth., Disp: , Rfl:    MOUNJARO 2.5 MG/0.5ML Pen, Inject 2.5 mg into the skin once a week., Disp: , Rfl:    ondansetron  (ZOFRAN -ODT) 4 MG disintegrating tablet, Take 1 tablet (4 mg total) by mouth every 8 (eight) hours as needed for nausea or vomiting., Disp: 20 tablet, Rfl: 0   oxyCODONE  (ROXICODONE ) 5 MG immediate release tablet, Take 1 tablet (5 mg total) by mouth every 8 (eight) hours as needed for up to 5 days for severe pain (pain score 7-10)., Disp: 15 tablet, Rfl: 0   pantoprazole  (PROTONIX ) 40 MG tablet, Take 40 mg by mouth daily., Disp: , Rfl:    ALBUTEROL IN, Inhale into the lungs., Disp: , Rfl:    Ascorbic Acid (VITAMIN C) 1000 MG tablet, Take 1,000 mg by mouth daily., Disp: , Rfl:    ferrous gluconate (FERGON) 324 MG tablet, Take 324 mg by mouth every morning., Disp: , Rfl:    pramipexole  (MIRAPEX ) 0.25 MG tablet, Take 0.5 mg by mouth at bedtime., Disp: , Rfl:    venlafaxine  XR (EFFEXOR -XR) 75 MG 24 hr capsule, Take 75 mg by mouth daily., Disp: , Rfl:   Past Medical Problems: Past Medical History:  Diagnosis Date   Anemia    BRCA negative 11/2023  MyRisk neg   Depression    Family history of breast cancer 11/2023   Increased risk of breast cancer 11/2023   IBIS=21.9%/riskscore=15.5%    Past Surgical History: Past Surgical History:  Procedure Laterality Date   APPLICATION OF WOUND VAC Right 05/15/2022   Procedure: APPLICATION OF WOUND VAC RIGHT BREAST;  Surgeon: Teretha Ferguson, MD;  Location: [facility] OR;  Service: Plastics;  Laterality: Right;   BREAST BIOPSY Right 2010   negative   BREAST BIOPSY Left 2016   NEG   BREAST EXCISIONAL BIOPSY Right 1997   negative   BREAST REDUCTION  SURGERY Bilateral 04/14/2022   Procedure: BILATERAL MAMMARY REDUCTION  (BREAST);  Surgeon: Rodman Clam, MD;  Location: [facility];  Service: Plastics;  Laterality: Bilateral;   CHOLECYSTECTOMY     DEBRIDEMENT AND CLOSURE WOUND Right 05/15/2022   Procedure: Debridement of necrotic tissue right breast;  Surgeon: Teretha Ferguson, MD;  Location: [facility] OR;  Service: Plastics;  Laterality: Right;   DEBRIDEMENT AND CLOSURE WOUND Right 06/19/2022   Procedure: Right breast debridement and nineteen centimeter width closure.;  Surgeon: Teretha Ferguson, MD;  Location: [facility];  Service: Plastics;  Laterality: Right;   INCISION AND DRAINAGE OF WOUND Right 05/18/2022   Procedure: DEBRIDEMENT RIGHT BREAST WITH WOUND VAC CHANGE;  Surgeon: Teretha Ferguson, MD;  Location: [facility];  Service: Plastics;  Laterality: Right;   REDUCTION MAMMAPLASTY Bilateral 04/2022    Social History: Social History   Socioeconomic History   Marital status: Married    Spouse name: Not on file   Number of children: Not on file   Years of education: Not on file   Highest education level: Not on file  Occupational History   Not on file  Tobacco Use   Smoking status: Never   Smokeless tobacco: Never  Vaping Use   Vaping status: Never Used  Substance and Sexual Activity   Alcohol use: Not Currently   Drug use: Never   Sexual activity: Yes    Comment: Spouse- surgical  Other Topics Concern   Not on file  Social History Narrative   Not on file   Social Drivers of Health   Financial Resource Strain: Not on file  Food Insecurity: Not on file  Transportation Needs: Not on file  Physical Activity: Not on file  Stress: Not on file  Social Connections: Not on file  Intimate Partner Violence: Not on file    Family History: Family History  Problem Relation Age of Onset   Breast cancer Mother 22       and again at 19    Review of Systems: ROS She denies any recent chest pain,  difficult breathing, leg swelling, fevers.  Physical Exam: Vital Signs BP 106/63 (BP Location: Left Arm, Patient Position: Sitting, Cuff Size: Large)   Pulse 98   Ht 5\' 2"  (1.575 m)   Wt 178 lb 12.8 oz (81.1 kg)   LMP 11/05/2023 (Exact Date)   SpO2 97%   BMI 32.70 kg/m   Physical Exam Constitutional:      General: Not in acute distress.    Appearance: Normal appearance. Not ill-appearing.  HENT:     Head: Normocephalic and atraumatic.  Eyes:     Pupils: Pupils are equal, round. Cardiovascular:     Rate and Rhythm: Normal rate.    Pulses: Normal pulses.  Pulmonary:     Effort: No respiratory distress or increased work of breathing.  Speaks in full  sentences. Abdominal:     General: Abdomen is flat. No distension.   Musculoskeletal: Normal range of motion. No lower extremity swelling or edema. Scattered varicosities. Skin:    General: Skin is warm and dry.     Findings: No erythema or rash.  Neurological:     Mental Status: Alert and oriented to person, place, and time.  Psychiatric:        Mood and Affect: Mood normal.        Behavior: Behavior normal.    Assessment/Plan: The patient is scheduled for abdominal liposuction with revision of right breast scar and fat grafting to right breast with Dr. Carolynne Citron.  Risks, benefits, and alternatives of procedure discussed, questions answered and consent obtained.    Smoking Status: Non-smoker.  Last Mammogram: 10/2023; Results: BI-RADS Category 1: Negative.  Caprini Score: 5; Risk Factors include: Age, BMI greater than 25, scattered varicosities, and length of planned surgery. Recommendation for mechanical prophylaxis. Encourage early ambulation.   Pictures obtained: 10/25/2023  Post-op Rx sent to pharmacy: Oxycodone  and Zofran   Patient was provided with the General Surgical Risk consent document and Pain Medication Agreement prior to their appointment.  They had adequate time to read through the risk consent documents and Pain  Medication Agreement. We also discussed them in person together during this preop appointment. All of their questions were answered to their satisfaction.  Recommended calling if they have any further questions.  Risk consent form and Pain Medication Agreement to be scanned into patient's chart.  The risks that can be encountered with and after liposuction were discussed and include the following but no limited to these:  Asymmetry, fluid accumulation, firmness of the area, fat necrosis with death of fat tissue, bleeding, infection, delayed healing, anesthesia risks, skin sensation changes, injury to structures including nerves, blood vessels, and muscles which may be temporary or permanent, allergies to tape, suture materials and glues, blood products, topical preparations or injected agents, skin and contour irregularities, skin discoloration and swelling, deep vein thrombosis, cardiac and pulmonary complications, pain, which may persist, persistent pain, recurrence of the lesion, poor healing of the incision, possible need for revisional surgery or staged procedures. Thiere can also be persistent swelling, poor wound healing, rippling or loose skin, worsening of cellulite, swelling, and thermal burn or heat injury from ultrasound with the ultrasound-assisted lipoplasty technique. Any change in weight fluctuations can alter the outcome.    Electronically signed by: Mariel Shope, PA-C 12/01/2023 3:26 PM

## 2023-12-16 ENCOUNTER — Encounter: Payer: Self-pay | Admitting: Plastic Surgery

## 2023-12-24 ENCOUNTER — Encounter (HOSPITAL_BASED_OUTPATIENT_CLINIC_OR_DEPARTMENT_OTHER): Admission: RE | Payer: Self-pay | Source: Ambulatory Visit

## 2023-12-24 ENCOUNTER — Ambulatory Visit (HOSPITAL_BASED_OUTPATIENT_CLINIC_OR_DEPARTMENT_OTHER): Admission: RE | Admit: 2023-12-24 | Source: Ambulatory Visit | Admitting: Plastic Surgery

## 2023-12-24 SURGERY — REVISION, RECONSTRUCTION, BREAST
Anesthesia: Choice | Laterality: Right

## 2023-12-30 ENCOUNTER — Encounter: Admitting: Plastic Surgery

## 2024-01-17 ENCOUNTER — Encounter: Admitting: Surgical

## 2024-02-08 ENCOUNTER — Encounter: Payer: Self-pay | Admitting: Obstetrics & Gynecology

## 2024-04-26 ENCOUNTER — Encounter: Payer: Self-pay | Admitting: Physical Therapy

## 2024-04-26 ENCOUNTER — Ambulatory Visit: Attending: Orthopedic Surgery | Admitting: Physical Therapy

## 2024-04-26 DIAGNOSIS — M25612 Stiffness of left shoulder, not elsewhere classified: Secondary | ICD-10-CM | POA: Insufficient documentation

## 2024-04-26 DIAGNOSIS — M25512 Pain in left shoulder: Secondary | ICD-10-CM | POA: Diagnosis present

## 2024-04-26 DIAGNOSIS — M6281 Muscle weakness (generalized): Secondary | ICD-10-CM | POA: Diagnosis present

## 2024-04-26 DIAGNOSIS — M5412 Radiculopathy, cervical region: Secondary | ICD-10-CM | POA: Insufficient documentation

## 2024-04-26 NOTE — Patient Instructions (Signed)
 CERVICAL SPINE (compressive) LOAD SENSITIVTY  Use this knowledge to modify activities or interrupt load throughout the day to decrease total load/irritation.   Increased with the following mechanical stressors: activities that activate large muscles moving the arms away from the sides, shrugging (UT/LS activation) lifting head against gravity from lying on back (SCM activation) stretching of large muscle groups around the neck (UT/LS, SCM) certain positions / postures (see charts below)  Decreased with  lying down traction decreasing large muscle group tension (not lifting head when lying) keeping head over shoulders

## 2024-04-26 NOTE — Therapy (Signed)
 OUTPATIENT PHYSICAL THERAPY EVALUATION   Patient Name: Kim Fischer MRN: [id number] DOB:09/25/1970, 53 y.o., female Today's Date: 04/26/2024  END OF SESSION:  PT End of Session - 04/26/24 1558     Visit Number 1    Number of Visits 17    Date for Recertification  07/19/24    Authorization Type [employer] reporting period from 04/26/2024    PT Start Time 1303    PT Stop Time 1355    PT Time Calculation (min) 52 min    Activity Tolerance Patient tolerated treatment well    Behavior During Therapy [facility] for tasks assessed/performed          Past Medical History:  Diagnosis Date   Anemia    BRCA negative 11/2023   MyRisk neg   Depression    Family history of breast cancer 11/2023   Increased risk of breast cancer 11/2023   IBIS=21.9%/riskscore=15.5%   Past Surgical History:  Procedure Laterality Date   APPLICATION OF WOUND VAC Right 05/15/2022   Procedure: APPLICATION OF WOUND VAC RIGHT BREAST;  Surgeon: Waddell Leonce NOVAK, MD;  Location: [facility] OR;  Service: Plastics;  Laterality: Right;   BREAST BIOPSY Right 2010   negative   BREAST BIOPSY Left 2016   NEG   BREAST EXCISIONAL BIOPSY Right 1997   negative   BREAST REDUCTION SURGERY Bilateral 04/14/2022   Procedure: BILATERAL MAMMARY REDUCTION  (BREAST);  Surgeon: Marene Sieving, MD;  Location: [facility];  Service: Plastics;  Laterality: Bilateral;   CHOLECYSTECTOMY     DEBRIDEMENT AND CLOSURE WOUND Right 05/15/2022   Procedure: Debridement of necrotic tissue right breast;  Surgeon: Waddell Leonce NOVAK, MD;  Location: [facility] OR;  Service: Plastics;  Laterality: Right;   DEBRIDEMENT AND CLOSURE WOUND Right 06/19/2022   Procedure: Right breast debridement and nineteen centimeter width closure.;  Surgeon: Waddell Leonce NOVAK, MD;  Location: [facility];  Service: Plastics;  Laterality: Right;   INCISION AND DRAINAGE OF WOUND Right 05/18/2022   Procedure: DEBRIDEMENT RIGHT BREAST WITH WOUND VAC  CHANGE;  Surgeon: Waddell Leonce NOVAK, MD;  Location: [facility] OR;  Service: Plastics;  Laterality: Right;   REDUCTION MAMMAPLASTY Bilateral 04/2022   Patient Active Problem List   Diagnosis Date Noted   Wound dehiscence 05/15/2022   Wound of right breast 05/15/2022    PCP: Cleotilde Oneil FALCON, MD  REFERRING PROVIDER: Kathlynn Sharper, MD  REFERRING DIAG: L shoulder tendinitis, shoulder impingement  THERAPY DIAG:  Left shoulder pain, unspecified chronicity  Radiculopathy, cervical region  Stiffness of left shoulder, not elsewhere classified  Muscle weakness (generalized)  Rationale for Evaluation and Treatment: Rehabilitation  ONSET DATE: months prior to PT evaluation (3-6 months).   SUBJECTIVE:  SUBJECTIVE STATEMENT: Patient states her problem is her L shoulder, despite what Dr. Kathlynn note says. It has only just been her L shoulder, which is also the shoulder they did the xray on.  Hand dominance: Right  What are your expectations for today? Would love to not hurt and get some range of motion back.  Manner of onset (traumatic, sudden, insidious): patient is unsure when it started. Several months ago (up to 6 months ago). She had to do some physical work at work that she doesn't normally do (but she does not think it caused the problem, just maybe exacerbated it). About 3 months ago it got to where she could not sleep on her left side anymore or reach back to get her seat belt. She tried to took aleve and stretch it and it just doesn't get better. She got a cortisone shot 6 ish weeks ago from Dr. Cleotilde. It helped a little at first, but after 2 weeks she couldn't tell she had the shot. She did the work she was not used to at the end of June. Pain was already ramping up and that work did not seem to help it.  Onset was  gradual.  Related signs and symptoms: unsure about weakness, but has numbness and tingling down the dorsal ulnar side of her arm and fingers (digits 4-5).  Previous episodes: used to work at an Adult nurse farm, Furniture conservator/restorer, and ended up having some pretty severe R shoulder pain. Went to a massage therapist to help. She had to back off things she was doing. She thought she never got full range of motion after that. Did have a past episode of neck pain that was severe and radiated towards the left side and made it hard for her to perform bed mobility. It gradually got better (unclear when this was as she remembered it at end of session).  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  Recreational Activities and hobbies: art related activities Facilities manager, pen and ink, metal artwork with some welding) Functional limitations: difficulty with anything that requires the use of the L UE, working, artwork, putting on seat belt, reaching, carrying, lifting, closing/opening door, sleeping, bed mobility.     PERTINENT HISTORY: Patient is a 53 y.o. female who presents to outpatient physical therapy with a referral for medical diagnosis L shoulder tendinitis, shoulder impingement. This patient's chief complaints consist of chronic pain in the posterior left shoulder with difficulty using the L UE, numbness/tingling in the left 4th/5th digits, leading to the following functional deficits: difficulty with anything that requires the use of the L UE, working, artwork, putting on seat belt, reaching, carrying, lifting, closing/opening door, sleeping, bed mobility. Relevant past medical history and comorbidities include the following: she has Wound dehiscence and Wound of right breast on their problem list. she  has a past medical history of Anemia, BRCA negative  (11/2023), Depression, Family history of breast cancer (11/2023), and Increased risk of breast cancer (11/2023). she  has a past surgical history that includes Breast biopsy (Right, 2010); Breast excisional biopsy (Right, 1997); Breast biopsy (Left, 2016); Breast reduction surgery (Bilateral, 04/14/2022); Cholecystectomy; Debridement and closure wound (Right, 05/15/2022); Application if wound vac (Right, 05/15/2022); Incision and drainage of wound (Right, 05/18/2022); Debridement and closure wound (Right, 06/19/2022); and Reduction mammaplasty (Bilateral, 04/2022). Patient denies hx of cancer, stroke, seizures, lung  problems, heart problems, diabetes, unexplained weight loss, unexplained changes in bowel or bladder problems, unexplained stumbling or dropping things, osteoporosis, and spinal surgery     PAIN: Are you having pain? Yes NPRS: Current: 2/10,  Best: 1/10, Worst: 8/10. Pain location: back of L shoulder over the posterior deltoid and posterior axilla, when abducting or elevating shoulder  Pain description: feels like a spasm after holding, tender and tight to touch. Numbness/tingling: tingling down the ulnar part of the the forearm after sitting with her arm propped out to the side, if she forgets her shoulder hurts and she does a motion with her arm it can make her arm ache.  Aggravating factors: using arm, abducting arm, pain when trying to  move at night (not before).  Relieving factors: avoiding use of L UE, hot pack, aleve, lidocaine  patch, Voltaren  PRECAUTIONS: None  WEIGHT BEARING RESTRICTIONS: No  FALLS:  Has patient fallen in last 6 months? Yes. Number of falls 1 when roller skating (okay now)  PLOF: no functional limitations from left shoulder or UE  PATIENT GOALS: Would love to not hurt and get some range of motion back.   NEXT MD VISIT:   OBJECTIVE  DIAGNOSTIC FINDINGS:  R shoulder xray report from 04/20/2024 (pt states it was her left):  Right shoulder X-ray:  Decreased subacromial space, mild acromioclavicular  (AC) joint arthritis, no significant glenohumeral osteoarthritis, no  degenerative cystic changes at the tuberosity, otherwise unremarkable  shoulder.   SELF-REPORTED FUNCTION Upper Extremity Functional Scale (UEFS): 54 (range 0-80)  Observation Posture Head position: slightly forward head Scapular positioning: WNL Movement patterns and painful movements: shoulder abduction, flexion, and ER  AROM/PROM Shoulder  Flexion R:  AROM: 158 PROM:  End feel:  L: AROM: 134 painful PROM: more than AROM End feel: guarded empty Abduction R: AROM: 180 PROM:  End feel:  L: AROM: 95 painful PROM:  End feel:  Extension R: AROM: 58 PROM:  End feel:  L: AROM: 45 painful PROM:  End feel:  External rotation R:  AROM: 106 PROM:  End feel:  L: AROM: 62 painful!! (At 90 degrees abduction) PROM: more than AROM End feel: painful Internal rotation R: AROM: T10 PROM:  End feel:  L: AROM: L5 pain PROM:  End feel: painful  Glenohumeral Joint Play Assesses for hypermobility Anterior glide L more guarded and painful than R  Posterior glide L more guarded and painful than R Compression:  R: no increased pain L: no increased pain  Traction/Distraction L less than R slightly Resisted Isometrics in Neutral Shoulder  Flexion R: WNL L: mild pain Abduction  R: WNL L: milder pain Extension  R: WNL L: WNL External rotation R: WNL L: WNL except starting to get sore after 4 reps (myotome fine) Internal rotation R: WNL L: mild pain  Palpation ? soft tissues of L shoulder and upper trap Anterior shoulder negative TTP at infraspinatus (most) and teres minor, mild at subscapularis  Cervical Spine Differentiation ? Traction alleviation:  positive in sitting (decreased concordant L shoulder pain at rest and with shoulder scaption) Positive in supine: decreased concordant L shoulder pain, improved L C5 myotome, and  improved L shoulder flexion and abduction AROM following 5x10 seconds on/off supine manual cervical traction.  TREATMENT Manual therapy: to reduce pain and tissue tension, improve range of motion, neuromodulation, in order to promote improved ability to complete functional activities. HOOKLYING Intermittent manual cervical spine traction 5x10 seconds on/off  Therapeutic exercise: therapeutic exercises that incorporate ONE parameter at one or more areas of the body to centralize symptoms, develop strength and endurance, range of motion, and flexibility required for successful completion of functional activities. Education about adhesive capsulitis (and monitoring of it), rotator cuff tendon irritation, and apparent contribution from the neck with load sensitivity.   Education on HEP including handout    Therapeutic activities: dynamic therapeutic activities incorporating MULTIPLE parameters or areas of the body designed to achieve improved functional performance. Supine to sit log roll with education on how to perform at home and why.    PATIENT EDUCATION: Education details: Exercise purpose/form. Self management techniques. Education on diagnosis, prognosis, POC, anatomy and physiology of current condition. Education on HEP including handout. Person educated: Patient Education method: Explanation, Demonstration, Tactile cues, Verbal cues, and Handouts Education comprehension: verbalized understanding and needs further education  HOME EXERCISE PROGRAM: Access Code: [email or web address] URL: [email or web address] Date: 04/26/2024 Prepared by: Camie Cleverly  Exercises - Sitting to Supine Roll   ASSESSMENT:  CLINICAL IMPRESSION: Patient is a 53 y.o. female referred to outpatient physical therapy with a medical diagnosis of L shoulder tendinitis, shoulder  impingement  who presents with signs and symptoms consistent with left shoulder pain, most likely referred or at least partially referred from the cervical spine. Patient has insidious onset, prior history of left sided neck pain, numbness and tingling in the L UE, non-RTC pattern of pain, and empty end feel with PROM, and positive traction alleviation test (improved pain, ROM, and myotome with/after cervical traction) that suggested cervical spine contribution. She may also have local shoulder joint problem that would be most likely extra-articular based on testing up to this point which was limited due to empty end feel and pain limiting motion more than joint restriction. Plan to address contributions from the spine and work on improving impairments and functional limitations at the shoulder to help patient return to prior level of functioning. Patient presents with significant pain, ROM, paresthesia, knowledge, muscle performance (strength/power/endurance), and activity tolerance impairments that are limiting ability to complete anything that requires the use of the L UE, working, artwork, putting on seat belt, reaching, carrying, lifting, closing/opening door, sleeping, bed mobility without difficulty. Patient will benefit from skilled physical therapy intervention to address current body structure impairments and activity limitations to improve function and work towards goals set in current POC in order to return to prior level of function or maximal functional improvement.   Mechanical sensitivities: cervical load   OBJECTIVE IMPAIRMENTS: decreased activity tolerance, decreased endurance, decreased knowledge of condition, decreased ROM, decreased strength, hypomobility, increased muscle spasms, impaired flexibility, impaired UE functional use, and pain.   ACTIVITY LIMITATIONS: carrying, lifting, sleeping, bed mobility, dressing, reach over head, and hygiene/grooming  PARTICIPATION LIMITATIONS: meal  prep, cleaning, laundry, interpersonal relationship, driving, shopping, community activity, occupation, and church  PERSONAL FACTORS: Fitness, Past/current experiences, Time since onset of injury/illness/exacerbation, and 3+ comorbidities: prior left sided neck pain,Anemia, Depression, she  has a past surgical history that includes Breast biopsy (Right, 2010); Breast excisional biopsy (Right, 1997); Breast biopsy (Left, 2016); Breast reduction surgery (Bilateral, 04/14/2022); Cholecystectomy; Debridement and closure wound (Right, 05/15/2022); are also affecting patient's functional outcome.   REHAB POTENTIAL: Good  CLINICAL DECISION MAKING: Evolving/moderate complexity  EVALUATION COMPLEXITY: Moderate   GOALS: Goals  reviewed with patient? No  SHORT TERM GOALS: Target date: 05/10/2024  Patient will be independent with initial home exercise program for self-management of symptoms. Baseline: Initial HEP provided at IE (04/26/24); Goal status: INITIAL  LONG TERM GOALS: Target date: 07/19/2024  Patient will be independent with a long-term home exercise program for self-management of symptoms.  Baseline: Initial HEP provided at IE (04/26/24); Goal status: INITIAL  2.  Patient will demonstrate improved Upper Extremity Functional Scale (UEFS) to equal or greater than 64/80 to demonstrate improvement in overall condition and self-reported functional ability.  Baseline: 54/80 (04/26/24); Goal status: INITIAL  3.  Patient will demonstrate L shoulder AROM equal to R shoulder AROM or norms (whichever is less) without increased pain except end range intermittant discomfort to improve her ability to complete reaching tasks with her L UE. Baseline: Limited and painful - see objective (04/26/24); Goal status: INITIAL  4.  Patient will demonstrate L UE MMT at least equal to R UE MMT without increased pain to improve her ability to complete lifting, pushing, and pulling activities with L UE. Baseline:  painful, full testing to be completed at later date - see objective exam (04/26/24); Goal status: INITIAL  5.  Patient will demonstrate improvement in Patient Specific Functional Scale (PSFS) of equal or greater than 8/10 points to reflect clinically significant improvement in patient's most valued functional activities. Baseline: to be measured at visit 2 as appropriate (04/26/24); Goal status: INITIAL  6.  Patient will report NPRS equal or less than 3/10 during functional activities during the last 2 weeks to improve their abilitly to complete community, work and/or recreational activities with less limitation. Baseline: 8/10 (04/26/24); Goal status: INITIAL   PLAN:  PT FREQUENCY: 2x/week  PT DURATION: 8-12 weeks  PLANNED INTERVENTIONS: 02835- PT Re-evaluation, 97750- Physical Performance Testing, 97110-Therapeutic exercises, 97530- Therapeutic activity, [id number]- Neuromuscular re-education, 97535- Self Care, 02859- Manual therapy, [id number]- Electrical stimulation (unattended), 20560 (1-2 muscles), 20561 (3+ muscles)- Dry Needling, Patient/Family education, Cryotherapy, and Moist heat  PLAN FOR NEXT SESSION: update HEP as appropriate, education on mechanical stressors and modifications of activities to avoid them, recover motor control and awareness of modifiers to mechanical sensitivities, retrain motor patterns, regain ROM, improve strength and resilience needed for  performing desired functional performance with appropriate ROM, strength, power, and endurance. Manual therapy as needed.   Camie SAUNDERS. Juli, PT, DPT, Cert. MDT 04/26/24, 8:27 PM  [facility] [address] [place], [place] [address] P: [phone] I F: [phone]

## 2024-05-01 ENCOUNTER — Ambulatory Visit: Admitting: Physical Therapy

## 2024-05-01 ENCOUNTER — Encounter: Payer: Self-pay | Admitting: Physical Therapy

## 2024-05-01 VITALS — BP 124/86 | HR 77

## 2024-05-01 DIAGNOSIS — M25512 Pain in left shoulder: Secondary | ICD-10-CM | POA: Diagnosis not present

## 2024-05-01 DIAGNOSIS — M25612 Stiffness of left shoulder, not elsewhere classified: Secondary | ICD-10-CM

## 2024-05-01 DIAGNOSIS — M5412 Radiculopathy, cervical region: Secondary | ICD-10-CM

## 2024-05-01 DIAGNOSIS — M6281 Muscle weakness (generalized): Secondary | ICD-10-CM

## 2024-05-01 NOTE — Therapy (Signed)
 OUTPATIENT PHYSICAL THERAPY TREATMENT   Patient Name: Kim Fischer MRN: [id number] DOB:07/20/1970, 53 y.o., female Today's Date: 05/01/2024  END OF SESSION:  PT End of Session - 05/01/24 1434     Visit Number 2    Number of Visits 17    Date for Recertification  07/19/24    Authorization Type [employer] reporting period from 04/26/2024    PT Start Time 1433    PT Stop Time 1516    PT Time Calculation (min) 43 min    Activity Tolerance Patient tolerated treatment well    Behavior During Therapy [facility] for tasks assessed/performed           Past Medical History:  Diagnosis Date   Anemia    BRCA negative 11/2023   MyRisk neg   Depression    Family history of breast cancer 11/2023   Increased risk of breast cancer 11/2023   IBIS=21.9%/riskscore=15.5%   Past Surgical History:  Procedure Laterality Date   APPLICATION OF WOUND VAC Right 05/15/2022   Procedure: APPLICATION OF WOUND VAC RIGHT BREAST;  Surgeon: Waddell Leonce NOVAK, MD;  Location: [facility] OR;  Service: Plastics;  Laterality: Right;   BREAST BIOPSY Right 2010   negative   BREAST BIOPSY Left 2016   NEG   BREAST EXCISIONAL BIOPSY Right 1997   negative   BREAST REDUCTION SURGERY Bilateral 04/14/2022   Procedure: BILATERAL MAMMARY REDUCTION  (BREAST);  Surgeon: Marene Sieving, MD;  Location: [facility];  Service: Plastics;  Laterality: Bilateral;   CHOLECYSTECTOMY     DEBRIDEMENT AND CLOSURE WOUND Right 05/15/2022   Procedure: Debridement of necrotic tissue right breast;  Surgeon: Waddell Leonce NOVAK, MD;  Location: [facility] OR;  Service: Plastics;  Laterality: Right;   DEBRIDEMENT AND CLOSURE WOUND Right 06/19/2022   Procedure: Right breast debridement and nineteen centimeter width closure.;  Surgeon: Waddell Leonce NOVAK, MD;  Location: [facility];  Service: Plastics;  Laterality: Right;   INCISION AND DRAINAGE OF WOUND Right 05/18/2022   Procedure: DEBRIDEMENT RIGHT BREAST WITH WOUND VAC  CHANGE;  Surgeon: Waddell Leonce NOVAK, MD;  Location: [facility] OR;  Service: Plastics;  Laterality: Right;   REDUCTION MAMMAPLASTY Bilateral 04/2022   Patient Active Problem List   Diagnosis Date Noted   Wound dehiscence 05/15/2022   Wound of right breast 05/15/2022    PCP: Cleotilde Oneil FALCON, MD  REFERRING PROVIDER: Kathlynn Sharper, MD  REFERRING DIAG: L shoulder tendinitis, shoulder impingement  THERAPY DIAG:  Left shoulder pain, unspecified chronicity  Radiculopathy, cervical region  Stiffness of left shoulder, not elsewhere classified  Muscle weakness (generalized)  Rationale for Evaluation and Treatment: Rehabilitation  ONSET DATE: months prior to PT evaluation (3-6 months).   SUBJECTIVE:  PERTINENT HISTORY: Patient is a 53 y.o. female who presents to outpatient physical therapy with a referral for medical diagnosis L shoulder tendinitis, shoulder impingement. This patient's chief complaints consist of chronic pain in the posterior left shoulder with difficulty using the L UE, numbness/tingling in the left 4th/5th digits, leading to the following functional deficits: difficulty with anything that requires the use of the L UE, working, artwork, putting on seat belt, reaching, carrying, lifting, closing/opening door, sleeping, bed mobility. Relevant past medical history and comorbidities include the following: she has Wound dehiscence and Wound of right breast on their problem list. she  has a past medical history of Anemia, BRCA negative (11/2023), Depression, Family history of breast cancer (11/2023), and Increased risk of breast cancer (11/2023). she  has a past surgical history that includes Breast biopsy (Right, 2010); Breast excisional biopsy (Right, 1997); Breast biopsy (Left, 2016); Breast reduction surgery  (Bilateral, 04/14/2022); Cholecystectomy; Debridement and closure wound (Right, 05/15/2022); Application if wound vac (Right, 05/15/2022); Incision and drainage of wound (Right, 05/18/2022); Debridement and closure wound (Right, 06/19/2022); and Reduction mammaplasty (Bilateral, 04/2022). Patient denies hx of cancer, stroke, seizures, lung problems, heart problems, diabetes, unexplained weight loss, unexplained changes in bowel or bladder problems, unexplained stumbling or dropping things, osteoporosis, and spinal surgery  Exercise history:   24 hour pain clock:  No pain when she opens her eyes, pain starts with dressing if she is not careful to avoid uncomfortable positions.  Increased pain after work compared to before. Less pain when working at desk only. Less pain when not working.   SUBJECTIVE STATEMENT: Patient states that she has felt mostly the same since last PT session and not worse.   PAIN:  NPRS: Current: 1/10 posterior left shoulder  From initial PT evaluation on 04/26/24:  NPRS: Best: 1/10, Worst: 8/10. Pain location: back of L shoulder over the posterior deltoid and posterior axilla, when abducting or elevating shoulder  Pain description: feels like a spasm after holding, tender and tight to touch. Numbness/tingling: tingling down the ulnar part of the the forearm after sitting with her arm propped out to the side, if she forgets her shoulder hurts and she does a motion with her arm it can make her arm ache.  Aggravating factors: using arm, abducting arm, pain when trying to  move at night (not before).  Relieving factors: avoiding use of L UE, hot pack, aleve, lidocaine  patch, Voltaren  PRECAUTIONS: None  WEIGHT BEARING RESTRICTIONS: No  FALLS:  Has patient fallen in last 6 months? Yes. Number of falls 1 when roller skating (okay now)  PLOF: no functional limitations from left shoulder or UE  PATIENT GOALS: Would love to not hurt and get some range of motion back.    NEXT MD VISIT:   OBJECTIVE  SELF-REPORTED FUNCTION Patient Specific Functional Scale (PSFS)  Reaching back to get anything: 3/10 Sleeping on left side: 5/10 Using left arm for normal moving things (like pulling/pushing/tossing): 4/10 Average: 4/10   SITTING  Vitals:   05/01/24 1440  BP: 124/86  Pulse: 77  SpO2: 100%   AROM Cervical Spine AROM: Flexion: 100% Extension: 50% Rotation:  R: 75%  L: 75% pulling in left neck (not pain) Side Flexion:  R: 90% L: 90%  Symptom Regionalization Rotation:  Thoracic: no improvement with pre-rotating T-spine to right. More tightness in neck with arms crossed over chest.  Neurological Testing Reflex Testing Biceps tendon (C5/C6) R: 2+ L: 0+ Wrist extensors (C6) R: 1+ L: 1+ Triceps tendon (  C7) R: 0+ L: 2+ Pronator quadratus (C7) R: 0+ L: 2+ Common wrist flexor tendons (C8) R: 1+ L: 2+ Myotome Testing Shoulder ER (C5) R: 4+/5 L: 4+/5 Biceps brachii (C5/C6) - Wrist extension (C6) R: 5/5 L: 4/5 fatiguing with reps Triceps (C7) R: 5/5 L: 5/5 MCP extension (C7) R: 5/5 L: 4+/5 Abductor digiti minimi (C8) - flexor digitorum (C8) R: WNL L: fatigues quicker and weaker Interossei (T1) B WNL  Neural Tension Screening test Pain in left shoulder with starting position (discontinued).  SUPINE L shoulder PROM Flexion: Before cervical spine traction: 143 After cervical spine traction: 147 ER at 90: Before cervical spine traction: 35 After cervical spine traction: 30 IR at 90 (isolated): Before cervical spine traction: 40 After cervical spine traction: 50 End range pain with all motions)                                                                                                                             TREATMENT  Therapeutic exercise: therapeutic exercises that incorporate ONE parameter at one or more areas of the body to centralize symptoms, develop strength and endurance, range of motion, and  flexibility required for successful completion of functional activities. Vitals measurement for systems review (see above)  Additional testing to assess cervical spine and neural referral to left UE>  Seated modified lat pull down with theraband anchored overhead, allowing band to pull arms overhead passively, and focusing on scapular retraction and depression on pull 1x10 with 5 second holds with BlueTB Added to HEP with video on pt's phone Provided door anchor strap and BlueTB for home use Additional reps learning how to perform and for video to reinforce technique Mild discomfort with pull but tolerable  Manual therapy: to reduce pain and tissue tension, improve range of motion, neuromodulation, in order to promote improved ability to complete functional activities. HOOKLYING Intermittent manual cervical spine traction 10x10 seconds on/off   Therapeutic activities: dynamic therapeutic activities incorporating MULTIPLE parameters or areas of the body designed to achieve improved functional performance. Review and practice of sit <> supine log roll with education on how to perform at home and why.  Education on activity modifications to help decrease cervical spine loading and shoulder irritation.    PATIENT EDUCATION: Education details: Exercise purpose/form. Self management techniques. Education on diagnosis, prognosis, POC, anatomy and physiology of current condition. Education on HEP including handout. Person educated: Patient Education method: Explanation, Demonstration, Tactile cues, Verbal cues, and Handouts Education comprehension: verbalized understanding and needs further education  HOME EXERCISE PROGRAM: Access Code: [email or web address] URL: [email or web address] Date: 04/26/2024 Prepared by: Camie Cleverly  Exercises - Sitting to Supine Roll   ASSESSMENT:  CLINICAL IMPRESSION: Cervical spine more thoroughly examined today. Decreased L sided DTR compared to right  at all levels except C6. Myotomes slightly weaker with L biceps (C5/6), C8,  and MTP extension (C7, but not with elbow extension which is also C7) weaker to vayring degrees on left compared  to right. L shoulder PROM also measured for flexion, ER and IR and this was painful and guarded end feel, no better after cervical spine traction. May benefit from re-testing DTR after traction instead of PROM. Patient started gentle scapular strengthening exercises with passive shoulder flexion that she tolerated well. Patient's pulling in the left neck did not appear to be coming from the thoracic spine. Plan to continue with progressing gentle shoulder ROM and postural/shoulder strengthening as tolerated as well as continued activity modifications.  Patient would benefit from continued management of limiting condition by skilled physical therapist to address remaining impairments and functional limitations to work towards stated goals and return to PLOF or maximal functional independence.   Mechanical sensitivities: cervical load?   From initial PT evaluation on 04/26/24:  Patient is a 53 y.o. female referred to outpatient physical therapy with a medical diagnosis of L shoulder tendinitis, shoulder impingement  who presents with signs and symptoms consistent with left shoulder pain, most likely referred or at least partially referred from the cervical spine. Patient has insidious onset, prior history of left sided neck pain, numbness and tingling in the L UE, non-RTC pattern of pain, and empty end feel with PROM, and positive traction alleviation test (improved pain, ROM, and myotome with/after cervical traction) that suggested cervical spine contribution. She may also have local shoulder joint problem that would be most likely extra-articular based on testing up to this point which was limited due to empty end feel and pain limiting motion more than joint restriction. Plan to address contributions from the spine and work  on improving impairments and functional limitations at the shoulder to help patient return to prior level of functioning. Patient presents with significant pain, ROM, paresthesia, knowledge, muscle performance (strength/power/endurance), and activity tolerance impairments that are limiting ability to complete anything that requires the use of the L UE, working, artwork, putting on seat belt, reaching, carrying, lifting, closing/opening door, sleeping, bed mobility without difficulty. Patient will benefit from skilled physical therapy intervention to address current body structure impairments and activity limitations to improve function and work towards goals set in current POC in order to return to prior level of function or maximal functional improvement.   Mechanical sensitivities: cervical load   OBJECTIVE IMPAIRMENTS: decreased activity tolerance, decreased endurance, decreased knowledge of condition, decreased ROM, decreased strength, hypomobility, increased muscle spasms, impaired flexibility, impaired UE functional use, and pain.   ACTIVITY LIMITATIONS: carrying, lifting, sleeping, bed mobility, dressing, reach over head, and hygiene/grooming  PARTICIPATION LIMITATIONS: meal prep, cleaning, laundry, interpersonal relationship, driving, shopping, community activity, occupation, and church  PERSONAL FACTORS: Fitness, Past/current experiences, Time since onset of injury/illness/exacerbation, and 3+ comorbidities: prior left sided neck pain,Anemia, Depression, she  has a past surgical history that includes Breast biopsy (Right, 2010); Breast excisional biopsy (Right, 1997); Breast biopsy (Left, 2016); Breast reduction surgery (Bilateral, 04/14/2022); Cholecystectomy; Debridement and closure wound (Right, 05/15/2022); are also affecting patient's functional outcome.   REHAB POTENTIAL: Good  CLINICAL DECISION MAKING: Evolving/moderate complexity  EVALUATION COMPLEXITY: Moderate   GOALS: Goals  reviewed with patient? No  SHORT TERM GOALS: Target date: 05/10/2024  Patient will be independent with initial home exercise program for self-management of symptoms. Baseline: Initial HEP provided at IE (04/26/24); Goal status: In progress  LONG TERM GOALS: Target date: 07/19/2024  Patient will be independent with a long-term home exercise program for self-management of symptoms.  Baseline: Initial HEP provided at IE (04/26/24); Goal status: In progress  2.  Patient will demonstrate improved  Upper Extremity Functional Scale (UEFS) to equal or greater than 64/80 to demonstrate improvement in overall condition and self-reported functional ability.  Baseline: 54/80 (04/26/24); Goal status: In progress  3.  Patient will demonstrate L shoulder AROM equal to R shoulder AROM or norms (whichever is less) without increased pain except end range intermittant discomfort to improve her ability to complete reaching tasks with her L UE. Baseline: Limited and painful - see objective (04/26/24); Goal status: In progress  4.  Patient will demonstrate L UE MMT at least equal to R UE MMT without increased pain to improve her ability to complete lifting, pushing, and pulling activities with L UE. Baseline: painful, full testing to be completed at later date - see objective exam (04/26/24); Goal status: In progress  5.  Patient will demonstrate improvement in Patient Specific Functional Scale (PSFS) of equal or greater than 8/10 points to reflect clinically significant improvement in patient's most valued functional activities. Baseline: to be measured at visit 2 as appropriate (04/26/24); 4/10 (05/01/24);  Goal status: In progress  6.  Patient will report NPRS equal or less than 3/10 during functional activities during the last 2 weeks to improve their abilitly to complete community, work and/or recreational activities with less limitation. Baseline: 8/10 (04/26/24); Goal status: In  progress   PLAN:  PT FREQUENCY: 2x/week  PT DURATION: 8-12 weeks  PLANNED INTERVENTIONS: 02835- PT Re-evaluation, 97750- Physical Performance Testing, 97110-Therapeutic exercises, 97530- Therapeutic activity, [id number]- Neuromuscular re-education, 97535- Self Care, 02859- Manual therapy, [id number]- Electrical stimulation (unattended), 20560 (1-2 muscles), 20561 (3+ muscles)- Dry Needling, Patient/Family education, Cryotherapy, and Moist heat  PLAN FOR NEXT SESSION: update HEP as appropriate, education on mechanical stressors and modifications of activities to avoid them, recover motor control and awareness of modifiers to mechanical sensitivities, retrain motor patterns, regain ROM, improve strength and resilience needed for  performing desired functional performance with appropriate ROM, strength, power, and endurance. Manual therapy as needed.   Camie SAUNDERS. Juli, PT, DPT, Cert. MDT 05/01/24, 6:18 PM  [facility] [address] [place], [place] [address] P: [phone] I F: [phone]

## 2024-05-03 ENCOUNTER — Encounter: Payer: Self-pay | Admitting: Physical Therapy

## 2024-05-03 ENCOUNTER — Ambulatory Visit: Admitting: Physical Therapy

## 2024-05-03 DIAGNOSIS — M25612 Stiffness of left shoulder, not elsewhere classified: Secondary | ICD-10-CM

## 2024-05-03 DIAGNOSIS — M25512 Pain in left shoulder: Secondary | ICD-10-CM

## 2024-05-03 DIAGNOSIS — M5412 Radiculopathy, cervical region: Secondary | ICD-10-CM

## 2024-05-03 DIAGNOSIS — M6281 Muscle weakness (generalized): Secondary | ICD-10-CM

## 2024-05-03 NOTE — Therapy (Signed)
 OUTPATIENT PHYSICAL THERAPY TREATMENT   Patient Name: Kim Fischer MRN: [id number] DOB:28-Mar-1971, 53 y.o., female Today's Date: 05/03/2024  END OF SESSION:  PT End of Session - 05/03/24 1325     Visit Number 3    Number of Visits 17    Date for Recertification  07/19/24    Authorization Type [employer] reporting period from 04/26/2024    PT Start Time 1301    PT Stop Time 1345    PT Time Calculation (min) 44 min    Activity Tolerance Patient tolerated treatment well    Behavior During Therapy [facility] for tasks assessed/performed            Past Medical History:  Diagnosis Date   Anemia    BRCA negative 11/2023   MyRisk neg   Depression    Family history of breast cancer 11/2023   Increased risk of breast cancer 11/2023   IBIS=21.9%/riskscore=15.5%   Past Surgical History:  Procedure Laterality Date   APPLICATION OF WOUND VAC Right 05/15/2022   Procedure: APPLICATION OF WOUND VAC RIGHT BREAST;  Surgeon: Waddell Leonce NOVAK, MD;  Location: [facility] OR;  Service: Plastics;  Laterality: Right;   BREAST BIOPSY Right 2010   negative   BREAST BIOPSY Left 2016   NEG   BREAST EXCISIONAL BIOPSY Right 1997   negative   BREAST REDUCTION SURGERY Bilateral 04/14/2022   Procedure: BILATERAL MAMMARY REDUCTION  (BREAST);  Surgeon: Marene Sieving, MD;  Location: [facility];  Service: Plastics;  Laterality: Bilateral;   CHOLECYSTECTOMY     DEBRIDEMENT AND CLOSURE WOUND Right 05/15/2022   Procedure: Debridement of necrotic tissue right breast;  Surgeon: Waddell Leonce NOVAK, MD;  Location: [facility];  Service: Plastics;  Laterality: Right;   DEBRIDEMENT AND CLOSURE WOUND Right 06/19/2022   Procedure: Right breast debridement and nineteen centimeter width closure.;  Surgeon: Waddell Leonce NOVAK, MD;  Location: [facility];  Service: Plastics;  Laterality: Right;   INCISION AND DRAINAGE OF WOUND Right 05/18/2022   Procedure: DEBRIDEMENT RIGHT BREAST WITH WOUND  VAC CHANGE;  Surgeon: Waddell Leonce NOVAK, MD;  Location: [facility] OR;  Service: Plastics;  Laterality: Right;   REDUCTION MAMMAPLASTY Bilateral 04/2022   Patient Active Problem List   Diagnosis Date Noted   Wound dehiscence 05/15/2022   Wound of right breast 05/15/2022    PCP: Cleotilde Oneil FALCON, MD  REFERRING PROVIDER: Kathlynn Sharper, MD  REFERRING DIAG: L shoulder tendinitis, shoulder impingement  THERAPY DIAG:  Left shoulder pain, unspecified chronicity  Radiculopathy, cervical region  Stiffness of left shoulder, not elsewhere classified  Muscle weakness (generalized)  Rationale for Evaluation and Treatment: Rehabilitation  ONSET DATE: months prior to PT evaluation (3-6 months).   SUBJECTIVE:  PERTINENT HISTORY: Patient is a 53 y.o. female who presents to outpatient physical therapy with a referral for medical diagnosis L shoulder tendinitis, shoulder impingement. This patient's chief complaints consist of chronic pain in the posterior left shoulder with difficulty using the L UE, numbness/tingling in the left 4th/5th digits, leading to the following functional deficits: difficulty with anything that requires the use of the L UE, working, artwork, putting on seat belt, reaching, carrying, lifting, closing/opening door, sleeping, bed mobility. Relevant past medical history and comorbidities include the following: she has Wound dehiscence and Wound of right breast on their problem list. she  has a past medical history of Anemia, BRCA negative (11/2023), Depression, Family history of breast cancer (11/2023), and Increased risk of breast cancer (11/2023). she  has a past surgical history that includes Breast biopsy (Right, 2010); Breast excisional biopsy (Right, 1997); Breast biopsy (Left, 2016); Breast reduction surgery  (Bilateral, 04/14/2022); Cholecystectomy; Debridement and closure wound (Right, 05/15/2022); Application if wound vac (Right, 05/15/2022); Incision and drainage of wound (Right, 05/18/2022); Debridement and closure wound (Right, 06/19/2022); and Reduction mammaplasty (Bilateral, 04/2022). Patient denies hx of cancer, stroke, seizures, lung problems, heart problems, diabetes, unexplained weight loss, unexplained changes in bowel or bladder problems, unexplained stumbling or dropping things, osteoporosis, and spinal surgery  Exercise history: used to exercise regularly: rowing favorite cardio, strengthen exercises with weights. She has a rowing machine at home and all the hand weights (3# - 25# pairs). She also has a 12# Med ball. She also has a bench. She used to have a trainer come to her house. She has also worked out at [employer]. She last exercised regularly 2 years ago that right before she had her breast reduction and had complications with wound vac machine for a couple of months. It took 8 months to close up. Has not returned since that.   24 hour pain clock:  No pain when she opens her eyes, pain starts with dressing if she is not careful to avoid uncomfortable positions.  Increased pain after work compared to before. Less pain when working at desk only. Less pain when not working.   SUBJECTIVE STATEMENT: Patient states that after her PT appointment on Monday her neck bothered her more on the left side. She put a heating pad on it. Yesterday she went to the [facility] and had a lot of aching in her left shoulder all day. It was a little better when she laid down on her back for 5 min. That relieved her shoulder pain.   PAIN:  NPRS: Current: 1/10 posterior left shoulder. 1/10 left neck.   From initial PT evaluation on 04/26/24:  NPRS: Best: 1/10, Worst: 8/10. Pain location: back of L shoulder over the posterior deltoid and posterior axilla, when abducting or elevating shoulder  Pain description:  feels like a spasm after holding, tender and tight to touch. Numbness/tingling: tingling down the ulnar part of the the forearm after sitting with her arm propped out to the side, if she forgets her shoulder hurts and she does a motion with her arm it can make her arm ache.  Aggravating factors: using arm, abducting arm, pain when trying to  move at night (not before).  Relieving factors: avoiding use of L UE, hot pack, aleve, lidocaine  patch, Voltaren  PRECAUTIONS: None  WEIGHT BEARING RESTRICTIONS: No  FALLS:  Has patient fallen in last 6 months? Yes. Number of falls 1 when roller skating (okay now)  PLOF: no functional limitations from left shoulder  or UE  PATIENT GOALS: Would love to not hurt and get some range of motion back.   NEXT MD VISIT:   OBJECTIVE    SITTING  Neurological Testing Reflex Testing (before traction) Biceps tendon (C5/C6) R: 2+ L: 0+ Reflex Testing (after exercises following traction) Biceps tendon (C5/C6) L: 0+  HOOKLYING (after traction) Neurological Testing Reflex Testing  Biceps tendon (C5/C6) R: 2+ L: 2+                                                                                                                           TREATMENT  Manual therapy: to reduce pain and tissue tension, improve range of motion, neuromodulation, in order to promote improved ability to complete functional activities. HOOKLYING Intermittent manual cervical spine traction 12x10 seconds on/off Improved L biceps reflex in supine  Therapeutic exercise: therapeutic exercises that incorporate ONE parameter at one or more areas of the body to centralize symptoms, develop strength and endurance, range of motion, and flexibility required for successful completion of functional activities. Reflex measurements before and after traction (see above)  Sidelying shoulder ER with towel roll under arm, focusing on getting to end range ER then maximizing intrascapular  activation 2x15 L side with 3 second holds AROM  Education on HEP including handout   Neuromuscular Re-education: a technique or exercise performed with the goal of improving the level of communication between the body and the brain, such as for balance, motor control, muscle activation patterns, coordination, desensitization, quality of muscle contraction, proprioception, and/or kinesthetic sense needed for successful and safe completion of functional activities.   Prone scapular retraction with slight hand lift to improve mid/lower trap activation/endurance and inhibit UT Palms up 1x15 with 3 second holds Palms down 1x15 with 3 second holds Mild cuing to retract scapuale and keep shoulders away from ears L shoulder discomfort in relaxed position with palms up   Self-Care/Home Management Training: to educate patient in self care including ADL training, meal preparation, compensatory training, safety procedures/instructions, use of assistive technology devices or adaptive equipment.   Pt educated on and practiced getting in and out of hooklying self-traction for the cervical spine with head in towel hammock hanging from door.  Pt able to safely get in and out of position and appears to understand well Added to HEP   Pt required multimodal cuing for proper technique and to facilitate improved neuromuscular control, strength, range of motion, and functional ability resulting in improved performance and form.   PATIENT EDUCATION: Education details: Exercise purpose/form. Self management techniques. Education on diagnosis, prognosis, POC, anatomy and physiology of current condition. Education on HEP including handout. Person educated: Patient Education method: Explanation, Demonstration, Tactile cues, Verbal cues, and Handouts Education comprehension: verbalized understanding and needs further education  HOME EXERCISE PROGRAM: Access Code: [email or web address] URL:  [email or web address] Date: 05/03/2024 Prepared by: Camie Cleverly  Exercises - Sitting to Supine Roll  - Supine Neck Traction with Doorway  - 5-10 min hold - Lat  Pull with band/cable  - 1 x daily - 2 sets - 10 reps - 5 seconds hold - Sidelying Shoulder External Rotation  - 1 x daily - 2 sets - 15 reps - 3 seconds hold - Prone Scapular Retraction  - 1 x daily - 2 sets - 15 reps - 3 seconds hold  ASSESSMENT:  CLINICAL IMPRESSION: Patient with clear improvement in L biceps reflex in supine following manual intermittent traction, strongly suggesting cervical spine load intolerance. Unfortunately this did not remain improved following exercises. Exercises progressed for shoulder girdle and postural strengthening and motor control in positions that do not promote compressive load of the cervical spine. Patient had some discomfort with relaxation of the L scapula in prone with shoulders IR, but without increasing pain with reps and no worsening by end of session. Pt reported reduction in pain by end of session. HEP updated to match progress made in session. Patient would benefit from continued management of limiting condition by skilled physical therapist to address remaining impairments and functional limitations to work towards stated goals and return to PLOF or maximal functional independence.   Mechanical sensitivities: cervical load?   From initial PT evaluation on 04/26/24:  Patient is a 53 y.o. female referred to outpatient physical therapy with a medical diagnosis of L shoulder tendinitis, shoulder impingement  who presents with signs and symptoms consistent with left shoulder pain, most likely referred or at least partially referred from the cervical spine. Patient has insidious onset, prior history of left sided neck pain, numbness and tingling in the L UE, non-RTC pattern of pain, and empty end feel with PROM, and positive traction alleviation test (improved pain, ROM, and myotome  with/after cervical traction) that suggested cervical spine contribution. She may also have local shoulder joint problem that would be most likely extra-articular based on testing up to this point which was limited due to empty end feel and pain limiting motion more than joint restriction. Plan to address contributions from the spine and work on improving impairments and functional limitations at the shoulder to help patient return to prior level of functioning. Patient presents with significant pain, ROM, paresthesia, knowledge, muscle performance (strength/power/endurance), and activity tolerance impairments that are limiting ability to complete anything that requires the use of the L UE, working, artwork, putting on seat belt, reaching, carrying, lifting, closing/opening door, sleeping, bed mobility without difficulty. Patient will benefit from skilled physical therapy intervention to address current body structure impairments and activity limitations to improve function and work towards goals set in current POC in order to return to prior level of function or maximal functional improvement.   Mechanical sensitivities: cervical load   OBJECTIVE IMPAIRMENTS: decreased activity tolerance, decreased endurance, decreased knowledge of condition, decreased ROM, decreased strength, hypomobility, increased muscle spasms, impaired flexibility, impaired UE functional use, and pain.   ACTIVITY LIMITATIONS: carrying, lifting, sleeping, bed mobility, dressing, reach over head, and hygiene/grooming  PARTICIPATION LIMITATIONS: meal prep, cleaning, laundry, interpersonal relationship, driving, shopping, community activity, occupation, and church  PERSONAL FACTORS: Fitness, Past/current experiences, Time since onset of injury/illness/exacerbation, and 3+ comorbidities: prior left sided neck pain,Anemia, Depression, she  has a past surgical history that includes Breast biopsy (Right, 2010); Breast excisional biopsy  (Right, 1997); Breast biopsy (Left, 2016); Breast reduction surgery (Bilateral, 04/14/2022); Cholecystectomy; Debridement and closure wound (Right, 05/15/2022); are also affecting patient's functional outcome.   REHAB POTENTIAL: Good  CLINICAL DECISION MAKING: Evolving/moderate complexity  EVALUATION COMPLEXITY: Moderate   GOALS: Goals reviewed with patient? No  SHORT TERM GOALS: Target date: 05/10/2024  Patient will be independent with initial home exercise program for self-management of symptoms. Baseline: Initial HEP provided at IE (04/26/24); Goal status: In progress  LONG TERM GOALS: Target date: 07/19/2024  Patient will be independent with a long-term home exercise program for self-management of symptoms.  Baseline: Initial HEP provided at IE (04/26/24); Goal status: In progress  2.  Patient will demonstrate improved Upper Extremity Functional Scale (UEFS) to equal or greater than 64/80 to demonstrate improvement in overall condition and self-reported functional ability.  Baseline: 54/80 (04/26/24); Goal status: In progress  3.  Patient will demonstrate L shoulder AROM equal to R shoulder AROM or norms (whichever is less) without increased pain except end range intermittant discomfort to improve her ability to complete reaching tasks with her L UE. Baseline: Limited and painful - see objective (04/26/24); Goal status: In progress  4.  Patient will demonstrate L UE MMT at least equal to R UE MMT without increased pain to improve her ability to complete lifting, pushing, and pulling activities with L UE. Baseline: painful, full testing to be completed at later date - see objective exam (04/26/24); Goal status: In progress  5.  Patient will demonstrate improvement in Patient Specific Functional Scale (PSFS) of equal or greater than 8/10 points to reflect clinically significant improvement in patient's most valued functional activities. Baseline: to be measured at visit 2 as  appropriate (04/26/24); 4/10 (05/01/24);  Goal status: In progress  6.  Patient will report NPRS equal or less than 3/10 during functional activities during the last 2 weeks to improve their abilitly to complete community, work and/or recreational activities with less limitation. Baseline: 8/10 (04/26/24); Goal status: In progress   PLAN:  PT FREQUENCY: 2x/week  PT DURATION: 8-12 weeks  PLANNED INTERVENTIONS: 97164- PT Re-evaluation, 97750- Physical Performance Testing, 97110-Therapeutic exercises, 97530- Therapeutic activity, [id number]- Neuromuscular re-education, 97535- Self Care, [id number]- Manual therapy, [id number]- Electrical stimulation (unattended), 20560 (1-2 muscles), 20561 (3+ muscles)- Dry Needling, Patient/Family education, Cryotherapy, and Moist heat  PLAN FOR NEXT SESSION: seated deep neck flexion, modified open book for thoracic mobility, and shoulder flexion AAROM ROM as tolerated (check for neuro tension first). update HEP as appropriate, education on mechanical stressors and modifications of activities to avoid them, recover motor control and awareness of modifiers to mechanical sensitivities, retrain motor patterns, regain ROM, improve strength and resilience needed for  performing desired functional performance with appropriate ROM, strength, power, and endurance. Manual therapy as needed.   Camie SAUNDERS. Juli, PT, DPT, Cert. MDT 05/03/24, 4:37 PM  [facility] [address] [place], [place] [address] P: [phone] I F: [phone]

## 2024-05-10 ENCOUNTER — Ambulatory Visit: Admitting: Physical Therapy

## 2024-05-10 ENCOUNTER — Encounter: Payer: Self-pay | Admitting: Physical Therapy

## 2024-05-10 DIAGNOSIS — M25512 Pain in left shoulder: Secondary | ICD-10-CM | POA: Diagnosis not present

## 2024-05-10 DIAGNOSIS — M25612 Stiffness of left shoulder, not elsewhere classified: Secondary | ICD-10-CM

## 2024-05-10 DIAGNOSIS — M5412 Radiculopathy, cervical region: Secondary | ICD-10-CM

## 2024-05-10 DIAGNOSIS — M6281 Muscle weakness (generalized): Secondary | ICD-10-CM

## 2024-05-10 NOTE — Therapy (Signed)
 OUTPATIENT PHYSICAL THERAPY TREATMENT   Patient Name: Kim Fischer MRN: [id number] DOB:May 08, 1971, 53 y.o., female Today's Date: 05/10/2024  END OF SESSION:  PT End of Session - 05/10/24 2100     Visit Number 4    Number of Visits 17    Date for Recertification  07/19/24    Authorization Type [employer] reporting period from 04/26/2024    PT Start Time 1605    PT Stop Time 1645    PT Time Calculation (min) 40 min    Activity Tolerance Patient tolerated treatment well;Patient limited by pain    Behavior During Therapy [facility] for tasks assessed/performed             Past Medical History:  Diagnosis Date   Anemia    BRCA negative 11/2023   MyRisk neg   Depression    Family history of breast cancer 11/2023   Increased risk of breast cancer 11/2023   IBIS=21.9%/riskscore=15.5%   Past Surgical History:  Procedure Laterality Date   APPLICATION OF WOUND VAC Right 05/15/2022   Procedure: APPLICATION OF WOUND VAC RIGHT BREAST;  Surgeon: Waddell Leonce NOVAK, MD;  Location: [facility] OR;  Service: Plastics;  Laterality: Right;   BREAST BIOPSY Right 2010   negative   BREAST BIOPSY Left 2016   NEG   BREAST EXCISIONAL BIOPSY Right 1997   negative   BREAST REDUCTION SURGERY Bilateral 04/14/2022   Procedure: BILATERAL MAMMARY REDUCTION  (BREAST);  Surgeon: Marene Sieving, MD;  Location: [facility];  Service: Plastics;  Laterality: Bilateral;   CHOLECYSTECTOMY     DEBRIDEMENT AND CLOSURE WOUND Right 05/15/2022   Procedure: Debridement of necrotic tissue right breast;  Surgeon: Waddell Leonce NOVAK, MD;  Location: [facility] OR;  Service: Plastics;  Laterality: Right;   DEBRIDEMENT AND CLOSURE WOUND Right 06/19/2022   Procedure: Right breast debridement and nineteen centimeter width closure.;  Surgeon: Waddell Leonce NOVAK, MD;  Location:  SURGERY CENTER;  Service: Plastics;  Laterality: Right;   INCISION AND DRAINAGE OF WOUND Right 05/18/2022   Procedure: DEBRIDEMENT  RIGHT BREAST WITH WOUND VAC CHANGE;  Surgeon: Waddell Leonce NOVAK, MD;  Location: [facility] OR;  Service: Plastics;  Laterality: Right;   REDUCTION MAMMAPLASTY Bilateral 04/2022   Patient Active Problem List   Diagnosis Date Noted   Wound dehiscence 05/15/2022   Wound of right breast 05/15/2022    PCP: Cleotilde Oneil FALCON, MD  REFERRING PROVIDER: Kathlynn Sharper, MD  REFERRING DIAG: L shoulder tendinitis, shoulder impingement  THERAPY DIAG:  No diagnosis found.  Rationale for Evaluation and Treatment: Rehabilitation  ONSET DATE: months prior to PT evaluation (3-6 months).   SUBJECTIVE:  PERTINENT HISTORY: Patient is a 53 y.o. female who presents to outpatient physical therapy with a referral for medical diagnosis L shoulder tendinitis, shoulder impingement. This patient's chief complaints consist of chronic pain in the posterior left shoulder with difficulty using the L UE, numbness/tingling in the left 4th/5th digits, leading to the following functional deficits: difficulty with anything that requires the use of the L UE, working, artwork, putting on seat belt, reaching, carrying, lifting, closing/opening door, sleeping, bed mobility. Relevant past medical history and comorbidities include the following: she has Wound dehiscence and Wound of right breast on their problem list. she  has a past medical history of Anemia, BRCA negative (11/2023), Depression, Family history of breast cancer (11/2023), and Increased risk of breast cancer (11/2023). she  has a past surgical history that includes Breast biopsy (Right, 2010); Breast excisional biopsy (Right, 1997); Breast biopsy (Left, 2016); Breast reduction surgery (Bilateral, 04/14/2022); Cholecystectomy; Debridement and closure wound (Right, 05/15/2022); Application if wound vac  (Right, 05/15/2022); Incision and drainage of wound (Right, 05/18/2022); Debridement and closure wound (Right, 06/19/2022); and Reduction mammaplasty (Bilateral, 04/2022). Patient denies hx of cancer, stroke, seizures, lung problems, heart problems, diabetes, unexplained weight loss, unexplained changes in bowel or bladder problems, unexplained stumbling or dropping things, osteoporosis, and spinal surgery  Exercise history: used to exercise regularly: rowing favorite cardio, strengthen exercises with weights. She has a rowing machine at home and all the hand weights (3# - 25# pairs). She also has a 12# Med ball. She also has a bench. She used to have a trainer come to her house. She has also worked out at [employer]. She last exercised regularly 2 years ago that right before she had her breast reduction and had complications with wound vac machine for a couple of months. It took 8 months to close up. Has not returned since that.   24 hour pain clock:  No pain when she opens her eyes, pain starts with dressing if she is not careful to avoid uncomfortable positions.  Increased pain after work compared to before. Less pain when working at desk only. Less pain when not working.   SUBJECTIVE STATEMENT: Patient states she has been doing well overall except today her left arm has been hurting all the way down and feeling like it is about to go numb. She feels it strongly in the medial left upper arm and over the extensor bundle of the forearm. She states she felt a little sore after last PT session but like muscle worked sore. She learned the wrong way to perform the prone intrascapular exercise because she forgot to use her scapula instead of her arm. She states she doesn't remember the discomfort in her L arm being so bad when she got up today. She worked from home yesterday and didn't go into work until late. She first remembers the feeling in her arm by 9am and worse after lunch. 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   PAIN:  NPRS: Current: 6/10 aching down the left arm, worst at medial upper arm and over extensor bundle, 2/10 posterior left shoulder.   From initial PT evaluation on 04/26/24:  NPRS: Best: 1/10, Worst: 8/10. Pain location: back of L shoulder over the posterior deltoid and posterior axilla, when abducting or elevating shoulder  Pain description: feels like a spasm after holding, tender and tight to touch. Numbness/tingling: tingling down the ulnar part of the the forearm after sitting with her arm propped out to the side, if she  forgets her shoulder hurts and she does a motion with her arm it can make her arm ache.  Aggravating factors: using arm, abducting arm, pain when trying to  move at night (not before).  Relieving factors: avoiding use of L UE, hot pack, aleve, lidocaine  patch, Voltaren  PRECAUTIONS: None  WEIGHT BEARING RESTRICTIONS: No  FALLS:  Has patient fallen in last 6 months? Yes. Number of falls 1 when roller skating (okay now)  PLOF: no functional limitations from left shoulder or UE  PATIENT GOALS: Would love to not hurt and get some range of motion back.   NEXT MD VISIT:   OBJECTIVE  Neurological Testing  Reflex testing:  HOOKLYING (before traction) Biceps tendon (C5/C6) before traction R: 2+ L: 2+ Sitting (after traction and ULNT) Biceps tendon (C5/C6) L: 2+ UPPER LIMB NEURODYNAMIC TESTS Upper Limb Tension Test 1 (ULTT1, Median nerve bias, Magee-ULTT1):  R = end range tension L  = not tolerated (see ULTT2A)  Upper Limb Tension Test 2A (ULTT2A, Median nerve bias, Magee-ULTT2):  R  = not tested L  = positive for concordant pain, worse with CL neck rotation, and wrist extension  Upper Limb Tension Test 2B (ULTT2B, Radial nerve bias, Magee-ULTT3):  R  = end range tension (WNL) L = positive for concordant pain, worse with CL neck rotation, and wrist extension  Upper Limb Tension Test 3 (ULTT3, Ulnar nerve bias, Magee-ULTT4):  R = end range  tension (WNL) L = positive for concordant pain, worse with CL neck rotation, and wrist extension    Repeated Movement Testing Pretest symptoms (in sitting with lumbar roll): pain into left UE Test Movement Symptom During Symptom After Mechanical Response Key Functional Test  Repeated retraction in sitting  1x10 peripheralizing no worse    Repeated retraction in sitting with self OP 1x10 peripheralizing no worse    Repeated retraction in sitting with clinician OP 1x6 centralizing better increased ROM (left rotation)   Repeated retraction in sitting with clinician OP 1x6 centralizing better increased ROM (left rotation)   Repeated retraction in sitting with self OP 1x10 no effect                                                                                                                              TREATMENT  Manual therapy: to reduce pain and tissue tension, improve range of motion, neuromodulation, in order to promote improved ability to complete functional activities. HOOKLYING Intermittent manual cervical spine traction 12x10 seconds on/off Improved L biceps reflex in supine  SEATED Repeated cervical retraction with clinician OP  2x6  (See chart above)  Therapeutic activities: dynamic therapeutic activities incorporating MULTIPLE parameters or areas of the body designed to achieve improved functional performance. HOOKLYING PROM upper limb tension testing to help confirm neck source of symptoms and inform education on functional movements involving use of L UE (see above)  Education to avoid positions of neural tension during daily activities, education on avoiding  forward head and how to use towel roll for sitting to keep from irritating neck and symptoms while pt continues to be able to complete functional activities.   Practiced and reviewed rationale and technique for log roll to decrease compressive load on neck  Therapeutic exercise: therapeutic exercises that incorporate  ONE parameter at one or more areas of the body to centralize symptoms, develop strength and endurance, range of motion, and flexibility required for successful completion of functional activities.  Reflex measurements before and after traction (see above)  Repeated motions testing/treatment (see chart above)  Education on HEP including handout (only do new exercises)   Pt required multimodal cuing for proper technique and to facilitate improved neuromuscular control, strength, range of motion, and functional ability resulting in improved performance and form.   PATIENT EDUCATION: Education details: Exercise purpose/form. Self management techniques. Education on diagnosis, prognosis, POC, anatomy and physiology of current condition. Education on HEP including handout. Person educated: Patient Education method: Explanation, Demonstration, Tactile cues, Verbal cues, and Handouts Education comprehension: verbalized understanding and needs further education  HOME EXERCISE PROGRAM: Access Code: [email or web address] URL: [email or web address] Date: 05/10/2024 Prepared by: Camie Cleverly  Exercises - Seated Posture with Lumbar Roll  - Cervical Retraction with Overpressure  - 2 sets - 10 reps - 1 second hold - every 2 hours frequency  ASSESSMENT:  CLINICAL IMPRESSION: Patient with clear worsening of radicular symptoms into the left arm today. Further testing confurmed neural tension in median, radial, and ulnar bias on the left side with reproduction of concordant shoulder pain symptoms brought on or alleviated by tensioning the nerve from either end (neck or wrist). Oddly, L biceps reflex resored today despite peripheralization of symtpoms. Patient had minimal response with cervical traction today, but was responsive to reapeated motions in a seated position. Her shoulder and arm symptoms were affected by cervical spine retratcion with centralization and decreased pain from 7/10 to 3-4/10 in  the left arm by end of session. Focus of PT and HEP changed to be on repeated cervical retraction with self overpressure throughout the day, avoidance of forward head/flexion. Patient left feeling better than arrival but continues to have more peripheralization than at last PT session.  Patient would benefit from continued management of limiting condition by skilled physical therapist to address remaining impairments and functional limitations to work towards stated goals and return to PLOF or maximal functional independence.    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   From initial PT evaluation on 04/26/24:  Patient is a 52 y.o. female referred to outpatient physical therapy with a medical diagnosis of L shoulder tendinitis, shoulder impingement  who presents with signs and symptoms consistent with left shoulder pain, most likely referred or at least partially referred from the cervical spine. Patient has insidious onset, prior history of left sided neck pain, numbness and tingling in the L UE, non-RTC pattern of pain, and empty end feel with PROM, and positive traction alleviation test (improved pain, ROM, and myotome with/after cervical traction) that suggested cervical spine contribution. She may also have local shoulder joint problem that would be most likely extra-articular based on testing up to this point which was limited due to empty end feel and pain limiting motion more than joint restriction. Plan to address contributions from the spine and work on improving impairments and functional limitations at the shoulder to help patient return to prior level of functioning. Patient presents  with significant pain, ROM, paresthesia, knowledge, muscle performance (strength/power/endurance), and activity tolerance impairments that are limiting ability to complete  anything that requires the use of the L UE, working, artwork, putting on seat belt, reaching, carrying, lifting, closing/opening door, sleeping, bed mobility without difficulty. Patient will benefit from skilled physical therapy intervention to address current body structure impairments and activity limitations to improve function and work towards goals set in current POC in order to return to prior level of function or maximal functional improvement.    OBJECTIVE IMPAIRMENTS: decreased activity tolerance, decreased endurance, decreased knowledge of condition, decreased ROM, decreased strength, hypomobility, increased muscle spasms, impaired flexibility, impaired UE functional use, and pain.   ACTIVITY LIMITATIONS: carrying, lifting, sleeping, bed mobility, dressing, reach over head, and hygiene/grooming  PARTICIPATION LIMITATIONS: meal prep, cleaning, laundry, interpersonal relationship, driving, shopping, community activity, occupation, and church  PERSONAL FACTORS: Fitness, Past/current experiences, Time since onset of injury/illness/exacerbation, and 3+ comorbidities: prior left sided neck pain,Anemia, Depression, she  has a past surgical history that includes Breast biopsy (Right, 2010); Breast excisional biopsy (Right, 1997); Breast biopsy (Left, 2016); Breast reduction surgery (Bilateral, 04/14/2022); Cholecystectomy; Debridement and closure wound (Right, 05/15/2022); are also affecting patient's functional outcome.   REHAB POTENTIAL: Good  CLINICAL DECISION MAKING: Evolving/moderate complexity  EVALUATION COMPLEXITY: Moderate   GOALS: Goals reviewed with patient? No  SHORT TERM GOALS: Target date: 05/10/2024  Patient will be independent with initial home exercise program for self-management of symptoms. Baseline: Initial HEP provided at IE (04/26/24); Goal status: In progress  LONG TERM GOALS: Target date: 07/19/2024  Patient will be independent with a long-term home exercise  program for self-management of symptoms.  Baseline: Initial HEP provided at IE (04/26/24); Goal status: In progress  2.  Patient will demonstrate improved Upper Extremity Functional Scale (UEFS) to equal or greater than 64/80 to demonstrate improvement in overall condition and self-reported functional ability.  Baseline: 54/80 (04/26/24); Goal status: In progress  3.  Patient will demonstrate L shoulder AROM equal to R shoulder AROM or norms (whichever is less) without increased pain except end range intermittant discomfort to improve her ability to complete reaching tasks with her L UE. Baseline: Limited and painful - see objective (04/26/24); Goal status: In progress  4.  Patient will demonstrate L UE MMT at least equal to R UE MMT without increased pain to improve her ability to complete lifting, pushing, and pulling activities with L UE. Baseline: painful, full testing to be completed at later date - see objective exam (04/26/24); Goal status: In progress  5.  Patient will demonstrate improvement in Patient Specific Functional Scale (PSFS) of equal or greater than 8/10 points to reflect clinically significant improvement in patient's most valued functional activities. Baseline: to be measured at visit 2 as appropriate (04/26/24); 4/10 (05/01/24);  Goal status: In progress  6.  Patient will report NPRS equal or less than 3/10 during functional activities during the last 2 weeks to improve their abilitly to complete community, work and/or recreational activities with less limitation. Baseline: 8/10 (04/26/24); Goal status: In progress   PLAN:  PT FREQUENCY: 2x/week  PT DURATION: 8-12 weeks  PLANNED INTERVENTIONS: 97164- PT Re-evaluation, 97750- Physical Performance Testing, 97110-Therapeutic exercises, 97530- Therapeutic activity, [id number]- Neuromuscular re-education, 97535- Self Care, [id number]- Manual therapy, [id number]- Electrical stimulation (unattended), 20560 (1-2 muscles), 20561 (3+  muscles)- Dry Needling, Patient/Family education, Cryotherapy, and Moist heat  PLAN FOR NEXT SESSION: follow MDT progressions for cervical derangement syndrome with extension preference as appropriate,  update HEP  as appropriate, education on mechanical stressors and modifications of activities to avoid them, recover motor control and awareness of modifiers to mechanical sensitivities, retrain motor patterns, regain ROM, improve strength and resilience needed for  performing desired functional performance with appropriate ROM, strength, power, and endurance. Manual therapy as needed.   Camie SAUNDERS. Juli, PT, DPT, Cert. MDT 05/10/24, 9:18 PM  [facility] [address] [place], [place] [address] P: [phone] I F: [phone]

## 2024-05-16 ENCOUNTER — Ambulatory Visit: Attending: Orthopedic Surgery | Admitting: Physical Therapy

## 2024-05-16 DIAGNOSIS — M5412 Radiculopathy, cervical region: Secondary | ICD-10-CM | POA: Diagnosis present

## 2024-05-16 DIAGNOSIS — M6281 Muscle weakness (generalized): Secondary | ICD-10-CM | POA: Insufficient documentation

## 2024-05-16 DIAGNOSIS — M25512 Pain in left shoulder: Secondary | ICD-10-CM | POA: Insufficient documentation

## 2024-05-16 DIAGNOSIS — M25612 Stiffness of left shoulder, not elsewhere classified: Secondary | ICD-10-CM | POA: Diagnosis present

## 2024-05-16 NOTE — Therapy (Unsigned)
 OUTPATIENT PHYSICAL THERAPY TREATMENT   Patient Name: Kim Fischer MRN: [id number] DOB:Feb 05, 1971, 53 y.o., female Today's Date: 05/16/2024  END OF SESSION:  PT End of Session - 05/16/24 1627     Visit Number 5    Number of Visits 17    Date for Recertification  07/19/24    Authorization Type [employer] reporting period from 04/26/2024    PT Start Time 1605    PT Stop Time 1646    PT Time Calculation (min) 41 min    Activity Tolerance Patient tolerated treatment well;Patient limited by pain    Behavior During Therapy [facility] for tasks assessed/performed           Past Medical History:  Diagnosis Date   Anemia    BRCA negative 11/2023   MyRisk neg   Depression    Family history of breast cancer 11/2023   Increased risk of breast cancer 11/2023   IBIS=21.9%/riskscore=15.5%   Past Surgical History:  Procedure Laterality Date   APPLICATION OF WOUND VAC Right 05/15/2022   Procedure: APPLICATION OF WOUND VAC RIGHT BREAST;  Surgeon: Waddell Leonce NOVAK, MD;  Location: [facility] OR;  Service: Plastics;  Laterality: Right;   BREAST BIOPSY Right 2010   negative   BREAST BIOPSY Left 2016   NEG   BREAST EXCISIONAL BIOPSY Right 1997   negative   BREAST REDUCTION SURGERY Bilateral 04/14/2022   Procedure: BILATERAL MAMMARY REDUCTION  (BREAST);  Surgeon: Marene Sieving, MD;  Location: [facility];  Service: Plastics;  Laterality: Bilateral;   CHOLECYSTECTOMY     DEBRIDEMENT AND CLOSURE WOUND Right 05/15/2022   Procedure: Debridement of necrotic tissue right breast;  Surgeon: Waddell Leonce NOVAK, MD;  Location: [facility] OR;  Service: Plastics;  Laterality: Right;   DEBRIDEMENT AND CLOSURE WOUND Right 06/19/2022   Procedure: Right breast debridement and nineteen centimeter width closure.;  Surgeon: Waddell Leonce NOVAK, MD;  Location: [facility];  Service: Plastics;  Laterality: Right;   INCISION AND DRAINAGE OF WOUND Right 05/18/2022   Procedure: DEBRIDEMENT  RIGHT BREAST WITH WOUND VAC CHANGE;  Surgeon: Waddell Leonce NOVAK, MD;  Location: [facility] OR;  Service: Plastics;  Laterality: Right;   REDUCTION MAMMAPLASTY Bilateral 04/2022   Patient Active Problem List   Diagnosis Date Noted   Wound dehiscence 05/15/2022   Wound of right breast 05/15/2022    PCP: Cleotilde Oneil FALCON, MD  REFERRING PROVIDER: Kathlynn Sharper, MD  REFERRING DIAG: L shoulder tendinitis, shoulder impingement  THERAPY DIAG:  No diagnosis found.  Rationale for Evaluation and Treatment: Rehabilitation  ONSET DATE: months prior to PT evaluation (3-6 months).   SUBJECTIVE:  PERTINENT HISTORY: Patient is a 53 y.o. female who presents to outpatient physical therapy with a referral for medical diagnosis L shoulder tendinitis, shoulder impingement. This patient's chief complaints consist of chronic pain in the posterior left shoulder with difficulty using the L UE, numbness/tingling in the left 4th/5th digits, leading to the following functional deficits: difficulty with anything that requires the use of the L UE, working, artwork, putting on seat belt, reaching, carrying, lifting, closing/opening door, sleeping, bed mobility. Relevant past medical history and comorbidities include the following: she has Wound dehiscence and Wound of right breast on their problem list. she  has a past medical history of Anemia, BRCA negative (11/2023), Depression, Family history of breast cancer (11/2023), and Increased risk of breast cancer (11/2023). she  has a past surgical history that includes Breast biopsy (Right, 2010); Breast excisional biopsy (Right, 1997); Breast biopsy (Left, 2016); Breast reduction surgery (Bilateral, 04/14/2022); Cholecystectomy; Debridement and closure wound (Right, 05/15/2022); Application if wound vac  (Right, 05/15/2022); Incision and drainage of wound (Right, 05/18/2022); Debridement and closure wound (Right, 06/19/2022); and Reduction mammaplasty (Bilateral, 04/2022). Patient denies hx of cancer, stroke, seizures, lung problems, heart problems, diabetes, unexplained weight loss, unexplained changes in bowel or bladder problems, unexplained stumbling or dropping things, osteoporosis, and spinal surgery  Exercise history: used to exercise regularly: rowing favorite cardio, strengthen exercises with weights. She has a rowing machine at home and all the hand weights (3# - 25# pairs). She also has a 12# Med ball. She also has a bench. She used to have a trainer come to her house. She has also worked out at [employer]. She last exercised regularly 2 years ago that right before she had her breast reduction and had complications with wound vac machine for a couple of months. It took 8 months to close up. Has not returned since that.   24 hour pain clock:  No pain when she opens her eyes, pain starts with dressing if she is not careful to avoid uncomfortable positions.  Increased pain after work compared to before. Less pain when working at desk only. Less pain when not working.   SUBJECTIVE STATEMENT: Patient states she strapped her L arm to her side to prevent using it and has not used it since last Thursday, and it is feeling better. It took until Friday night until she wasn't getting any aching or throbbing. Thursday night was not comfortable, but by midday Friday she thinks she didn't have any throbbing. She doesn't have any neck pain. She has been doing the cervical retraction with self overpressure at least every 2 hours when awake. No problems with that.   PAIN:  NPRS: Current: 0/10  From initial PT evaluation on 04/26/24:  NPRS: Best: 1/10, Worst: 8/10. Pain location: back of L shoulder over the posterior deltoid and posterior axilla, when abducting or elevating shoulder  Pain description: feels  like a spasm after holding, tender and tight to touch. Numbness/tingling: tingling down the ulnar part of the the forearm after sitting with her arm propped out to the side, if she forgets her shoulder hurts and she does a motion with her arm it can make her arm ache.  Aggravating factors: using arm, abducting arm, pain when trying to  move at night (not before).  Relieving factors: avoiding use of L UE, hot pack, aleve, lidocaine  patch, Voltaren  PRECAUTIONS: None  WEIGHT BEARING RESTRICTIONS: No  FALLS:  Has patient fallen in last 6 months? Yes. Number of falls 1 when  roller skating (okay now)  PLOF: no functional limitations from left shoulder or UE  PATIENT GOALS: Would love to not hurt and get some range of motion back.   NEXT MD VISIT:   OBJECTIVE  SEATED Neurological Testing Reflex testing:  Biceps tendon (C5/C6) before traction SEATED at start of session R: 2+ L: 2+ L UE AROM  L shoulder ER at 90 degrees abduction (limited ROM and painful)   L UE neural tension testing to find position that does not cause neural tension to inform exercise prescription.  L shoulder flexion causes concordant shoulder pain near 90 degrees, made better or relieved by wrist flexion or elbow flexion.  L shoulder ER increases concordant shoulder pain  Seated traction alleviation test at cervical spine:  slight decrease in concordant L shoulder pain   Repeated Movement Testing Pretest symptoms (in sitting with lumbar roll): pain into left UE Test Movement Symptom During Symptom After Mechanical Response Key Functional Test  Repeated retraction in sitting with self OP 1x10 No effect no worse    Repeated retraction in sitting with clinician OP 1x6    L shoulder ER at 90 degrees abduction, ROM restricted but not painful  Repeated retraction in sitting with clinician OP 1x6    Moves further before it is painful  Repeated retraction in sitting with clinician OP 1x6   Right cervical rotation a  little better same  Repeated cervical retraction to extension with rotation and traction 1x6 No change No change No change same                                                                                                                           TREATMENT  Manual therapy: to reduce pain and tissue tension, improve range of motion, neuromodulation, in order to promote improved ability to complete functional activities. HOOKLYING Intermittent manual cervical spine traction 12x10 seconds on/off No change Repeated cervical retraction to extension with rotation and traction 1x6 (see chart above)  PROM L shoulder to abduction and ER reproduced symptoms  SEATED Repeated cervical retraction with clinician OP  2x6  (See chart above)  Cervical spine traction alleviation test  Therapeutic exercise: therapeutic exercises that incorporate ONE parameter at one or more areas of the body to centralize symptoms, develop strength and endurance, range of motion, and flexibility required for successful completion of functional activities.  L shoulder AROM testing to assess response to interventions and re-assess neural tension (see above)  Repeated motions testing/treatment (see chart above)  Education about progress, differential diagnosis, recommendations for MRI  Kneeling prayer stretch to gently stretch shoulder flexion ROM without tensioning nerves 1x20 to edge of pain, with instructions to continue only if no worse after each rep.  Added to HEP  Standing L shoulder ER stretch in doorway 1x20 to edge of pain, with instructions to continue only if no worse after each rep.  Added to HEP  Education on HEP including handout with instructions to continue strap  on arm with addition of the new exercises to repeated retraction exercises.    Pt required multimodal cuing for proper technique and to facilitate improved neuromuscular control, strength, range of motion, and functional ability  resulting in improved performance and form.   PATIENT EDUCATION: Education details: Exercise purpose/form. Self management techniques. Education on diagnosis, prognosis, POC, anatomy and physiology of current condition. Education on HEP including handout. Person educated: Patient Education method: Explanation, Demonstration, Tactile cues, Verbal cues, and Handouts Education comprehension: verbalized understanding and needs further education  HOME EXERCISE PROGRAM: Access Code: [email or web address] URL: [email or web address] Date: 05/17/2024 Prepared by: Camie Cleverly  Exercises - Seated Posture with Lumbar Roll  - Cervical Retraction with Overpressure  - 2 sets - 10 reps - 1 second hold - every 2 hours frequency - Standing Shoulder External Rotation Stretch in Doorway  - 3 x daily - 1 sets - 20 reps  HOME EXERCISE PROGRAM [[id number]]  Prayer Stretch -  Repeat 20 Repetitions, Hold 1 Second(s), Complete 2 Sets, Perform 3 Times a Day  ASSESSMENT:  CLINICAL IMPRESSION: Patient's symptoms much better than last week after she rested the L UE by using a strap around her upper arm to keep her form using it.  She continued to use her R UE, so her neck was still loaded from that side without causing increased symptoms. She continued to attempt to be careful with her posture. Today re-introduced L shoulder abduction and ER, which instantly produced her concordant pain with one rep. Still with positive (but subtle) traction alleviation test. Very minimal to no improvement with progression of forces and changed position with MDT/McKenzie repeated motions for extension. Did not complete thoracic extension beyond the supine clinician assisted procedure due to pt unable to get into the position comfortably, but noted for significant hyperkyphosis at the CT junction. She also had no further improvement with cervical spine traction. She continues to have positive neural tension signs on the L UE, with hand  and elbow position relieving symptoms. Patient not responding today to further attempts to improve symptoms directed at the cervical spine, but continues to be limited and made worse with AAROM, AROM, and PROM. Decreased use of L UE decreases pain but may lead to stiffness and weakness from lack of use. Does resemble adhesive capsulitis, but is worse with repeated ROM activities. To address possible capsular tightness, re-introduced some AAROM exercises that are in positions that minimize neural tension  to allow recommended gentle ROM exercises for adhesive capsulitis without exacerbating neural stretch sensitivity and cervical spine. Pt recommended to keep everything the same except add the two new stretches to help isolate their effect by next visit. Patient expresses desire for advanced imaging at this point. She has not made significant progress so far and the improvement she has today after stopping using her arm since last visit is actually just back to baseline from her flair last visit. PT has been able to alter symptoms at this point, but not actually improve function. PT recommended she consult with referring physician about need for advanced imaging due to her preference and lack of significant progress yet. Patient would benefit from continued management of limiting condition by skilled physical therapist to address remaining impairments and functional limitations to work towards stated goals and return to PLOF or maximal functional independence.   Mechanical sensitivities: cervical load, L neural tension (all nerves) MDT/McKenzie classification: cervical derangement syndrome with extension directional preference responding to clinician forces in loaded position.   From initial PT  evaluation on 04/26/24:  Patient is a 53 y.o. female referred to outpatient physical therapy with a medical diagnosis of L shoulder tendinitis, shoulder impingement  who presents with signs and symptoms consistent with  left shoulder pain, most likely referred or at least partially referred from the cervical spine. Patient has insidious onset, prior history of left sided neck pain, numbness and tingling in the L UE, non-RTC pattern of pain, and empty end feel with PROM, and positive traction alleviation test (improved pain, ROM, and myotome with/after cervical traction) that suggested cervical spine contribution. She may also have local shoulder joint problem that would be most likely extra-articular based on testing up to this point which was limited due to empty end feel and pain limiting motion more than joint restriction. Plan to address contributions from the spine and work on improving impairments and functional limitations at the shoulder to help patient return to prior level of functioning. Patient presents with significant pain, ROM, paresthesia, knowledge, muscle performance (strength/power/endurance), and activity tolerance impairments that are limiting ability to complete anything that requires the use of the L UE, working, artwork, putting on seat belt, reaching, carrying, lifting, closing/opening door, sleeping, bed mobility without difficulty. Patient will benefit from skilled physical therapy intervention to address current body structure impairments and activity limitations to improve function and work towards goals set in current POC in order to return to prior level of function or maximal functional improvement.    OBJECTIVE IMPAIRMENTS: decreased activity tolerance, decreased endurance, decreased knowledge of condition, decreased ROM, decreased strength, hypomobility, increased muscle spasms, impaired flexibility, impaired UE functional use, and pain.   ACTIVITY LIMITATIONS: carrying, lifting, sleeping, bed mobility, dressing, reach over head, and hygiene/grooming  PARTICIPATION LIMITATIONS: meal prep, cleaning, laundry, interpersonal relationship, driving, shopping, community activity, occupation, and  church  PERSONAL FACTORS: Fitness, Past/current experiences, Time since onset of injury/illness/exacerbation, and 3+ comorbidities: prior left sided neck pain,Anemia, Depression, she  has a past surgical history that includes Breast biopsy (Right, 2010); Breast excisional biopsy (Right, 1997); Breast biopsy (Left, 2016); Breast reduction surgery (Bilateral, 04/14/2022); Cholecystectomy; Debridement and closure wound (Right, 05/15/2022); are also affecting patient's functional outcome.   REHAB POTENTIAL: Good  CLINICAL DECISION MAKING: Evolving/moderate complexity  EVALUATION COMPLEXITY: Moderate   GOALS: Goals reviewed with patient? No  SHORT TERM GOALS: Target date: 05/10/2024  Patient will be independent with initial home exercise program for self-management of symptoms. Baseline: Initial HEP provided at IE (04/26/24); Goal status: In progress  LONG TERM GOALS: Target date: 07/19/2024  Patient will be independent with a long-term home exercise program for self-management of symptoms.  Baseline: Initial HEP provided at IE (04/26/24); Goal status: In progress  2.  Patient will demonstrate improved Upper Extremity Functional Scale (UEFS) to equal or greater than 64/80 to demonstrate improvement in overall condition and self-reported functional ability.  Baseline: 54/80 (04/26/24); Goal status: In progress  3.  Patient will demonstrate L shoulder AROM equal to R shoulder AROM or norms (whichever is less) without increased pain except end range intermittant discomfort to improve her ability to complete reaching tasks with her L UE. Baseline: Limited and painful - see objective (04/26/24); Goal status: In progress  4.  Patient will demonstrate L UE MMT at least equal to R UE MMT without increased pain to improve her ability to complete lifting, pushing, and pulling activities with L UE. Baseline: painful, full testing to be completed at later date - see objective exam (04/26/24); Goal  status: In progress  5.  Patient will demonstrate improvement in Patient Specific Functional Scale (PSFS) of equal or greater than 8/10 points to reflect clinically significant improvement in patient's most valued functional activities. Baseline: to be measured at visit 2 as appropriate (04/26/24); 4/10 (05/01/24);  Goal status: In progress  6.  Patient will report NPRS equal or less than 3/10 during functional activities during the last 2 weeks to improve their abilitly to complete community, work and/or recreational activities with less limitation. Baseline: 8/10 (04/26/24); Goal status: In progress   PLAN:  PT FREQUENCY: 2x/week  PT DURATION: 8-12 weeks  PLANNED INTERVENTIONS: 02835- PT Re-evaluation, 97750- Physical Performance Testing, 97110-Therapeutic exercises, 97530- Therapeutic activity, [id number]- Neuromuscular re-education, 97535- Self Care, 02859- Manual therapy, [id number]- Electrical stimulation (unattended), 20560 (1-2 muscles), 20561 (3+ muscles)- Dry Needling, Patient/Family education, Cryotherapy, and Moist heat  PLAN FOR NEXT SESSION: assess response to two new exercises, try to wean off arm strap to prevent stiffness and disuse atrophy, continue to modify activities to decrease irritation while maximizing function and working on improving ROM without stretching nerves. update HEP as appropriate, education on mechanical stressors and modifications of activities to avoid them, recover motor control and awareness of modifiers to mechanical sensitivities, retrain motor patterns, regain ROM, improve strength and resilience needed for  performing desired functional performance with appropriate ROM, strength, power, and endurance. Manual therapy as needed.   Camie SAUNDERS. Juli, PT, DPT, Cert. MDT 05/16/24, 6:33 PM  [facility] [address] [place], [place] [address] P: [phone] I F: [phone]

## 2024-05-18 ENCOUNTER — Encounter: Payer: Self-pay | Admitting: Physical Therapy

## 2024-05-18 ENCOUNTER — Ambulatory Visit: Admitting: Physical Therapy

## 2024-05-18 VITALS — BP 127/79 | HR 88

## 2024-05-18 DIAGNOSIS — M6281 Muscle weakness (generalized): Secondary | ICD-10-CM

## 2024-05-18 DIAGNOSIS — M25512 Pain in left shoulder: Secondary | ICD-10-CM | POA: Diagnosis not present

## 2024-05-18 DIAGNOSIS — M25612 Stiffness of left shoulder, not elsewhere classified: Secondary | ICD-10-CM

## 2024-05-18 DIAGNOSIS — M5412 Radiculopathy, cervical region: Secondary | ICD-10-CM

## 2024-05-18 NOTE — Therapy (Signed)
 OUTPATIENT PHYSICAL THERAPY TREATMENT   Patient Name: Kim Fischer MRN: [id number] DOB:Nov 28, 1970, 53 y.o., female Today's Date: 05/18/2024  END OF SESSION:  PT End of Session - 05/18/24 2005     Visit Number 6    Number of Visits 17    Date for Recertification  07/19/24    Authorization Type [employer] reporting period from 04/26/2024    PT Start Time 1658    PT Stop Time 1730    PT Time Calculation (min) 32 min    Activity Tolerance Patient limited by pain;Other (comment)   vasovagal type response   Behavior During Therapy [facility] for tasks assessed/performed            Past Medical History:  Diagnosis Date   Anemia    BRCA negative 11/2023   MyRisk neg   Depression    Family history of breast cancer 11/2023   Increased risk of breast cancer 11/2023   IBIS=21.9%/riskscore=15.5%   Past Surgical History:  Procedure Laterality Date   APPLICATION OF WOUND VAC Right 05/15/2022   Procedure: APPLICATION OF WOUND VAC RIGHT BREAST;  Surgeon: Waddell Leonce NOVAK, MD;  Location: [facility] OR;  Service: Plastics;  Laterality: Right;   BREAST BIOPSY Right 2010   negative   BREAST BIOPSY Left 2016   NEG   BREAST EXCISIONAL BIOPSY Right 1997   negative   BREAST REDUCTION SURGERY Bilateral 04/14/2022   Procedure: BILATERAL MAMMARY REDUCTION  (BREAST);  Surgeon: Marene Sieving, MD;  Location: [facility];  Service: Plastics;  Laterality: Bilateral;   CHOLECYSTECTOMY     DEBRIDEMENT AND CLOSURE WOUND Right 05/15/2022   Procedure: Debridement of necrotic tissue right breast;  Surgeon: Waddell Leonce NOVAK, MD;  Location: [facility];  Service: Plastics;  Laterality: Right;   DEBRIDEMENT AND CLOSURE WOUND Right 06/19/2022   Procedure: Right breast debridement and nineteen centimeter width closure.;  Surgeon: Waddell Leonce NOVAK, MD;  Location: [facility];  Service: Plastics;  Laterality: Right;   INCISION AND DRAINAGE OF WOUND Right 05/18/2022   Procedure:  DEBRIDEMENT RIGHT BREAST WITH WOUND VAC CHANGE;  Surgeon: Waddell Leonce NOVAK, MD;  Location: [facility] OR;  Service: Plastics;  Laterality: Right;   REDUCTION MAMMAPLASTY Bilateral 04/2022   Patient Active Problem List   Diagnosis Date Noted   Wound dehiscence 05/15/2022   Wound of right breast 05/15/2022    PCP: Cleotilde Oneil FALCON, MD  REFERRING PROVIDER: Kathlynn Sharper, MD  REFERRING DIAG: L shoulder tendinitis, shoulder impingement  THERAPY DIAG:  Left shoulder pain, unspecified chronicity  Radiculopathy, cervical region  Stiffness of left shoulder, not elsewhere classified  Muscle weakness (generalized)  Rationale for Evaluation and Treatment: Rehabilitation  ONSET DATE: months prior to PT evaluation (3-6 months).   SUBJECTIVE:  PERTINENT HISTORY: Patient is a 52 y.o. female who presents to outpatient physical therapy with a referral for medical diagnosis L shoulder tendinitis, shoulder impingement. This patient's chief complaints consist of chronic pain in the posterior left shoulder with difficulty using the L UE, numbness/tingling in the left 4th/5th digits, leading to the following functional deficits: difficulty with anything that requires the use of the L UE, working, artwork, putting on seat belt, reaching, carrying, lifting, closing/opening door, sleeping, bed mobility. Relevant past medical history and comorbidities include the following: she has Wound dehiscence and Wound of right breast on their problem list. she  has a past medical history of Anemia, BRCA negative (11/2023), Depression, Family history of breast cancer (11/2023), and Increased risk of breast cancer (11/2023). she  has a past surgical history that includes Breast biopsy (Right, 2010); Breast excisional biopsy (Right, 1997); Breast biopsy  (Left, 2016); Breast reduction surgery (Bilateral, 04/14/2022); Cholecystectomy; Debridement and closure wound (Right, 05/15/2022); Application if wound vac (Right, 05/15/2022); Incision and drainage of wound (Right, 05/18/2022); Debridement and closure wound (Right, 06/19/2022); and Reduction mammaplasty (Bilateral, 04/2022). Patient denies hx of cancer, stroke, seizures, lung problems, heart problems, diabetes, unexplained weight loss, unexplained changes in bowel or bladder problems, unexplained stumbling or dropping things, osteoporosis, and spinal surgery  Exercise history: used to exercise regularly: rowing favorite cardio, strengthen exercises with weights. She has a rowing machine at home and all the hand weights (3# - 25# pairs). She also has a 12# Med ball. She also has a bench. She used to have a trainer come to her house. She has also worked out at [employer]. She last exercised regularly 2 years ago that right before she had her breast reduction and had complications with wound vac machine for a couple of months. It took 8 months to close up. Has not returned since that.   24 hour pain clock:  No pain when she opens her eyes, pain starts with dressing if she is not careful to avoid uncomfortable positions.  Increased pain after work compared to before. Less pain when working at desk only. Less pain when not working.   SUBJECTIVE STATEMENT: Patient states she is not feeling great today. Wednesday morning she woke up with a headache, which went away about lunch time. She did it during the morning and she did part of them at noon. She stopped because she started feeling the pain more. She did her  exercises this morning and it wasn't great. She continued to wear her strap to  keep from moving her left arm. This afternoon she started having pain at the CT junction that has traveled down her back to approximately T9 and expanded to about 4 inches across, and her left arm started aching more. She layed on  her back once before lunch time and before she came to PT, but this keeps getting worse. She fees like she cannot sit up. She feels very tired. She is feeling progressively worse. She did not feel this bad 2 hours ago. She got an appointment with Dr. Kathlynn at 8:45 tomorrow.   PAIN:  NPRS: Current: 5-6/10 Ct junction to approx T9 pain over posterior upper arm to elbow and lateral deltoid region. Denies numbness in her hand.   From initial PT evaluation on 04/26/24:  NPRS: Best: 1/10, Worst: 8/10. Pain location: back of L shoulder over the posterior deltoid and posterior axilla, when abducting or elevating shoulder  Pain description: feels like a spasm after holding, tender and tight  to touch. Numbness/tingling: tingling down the ulnar part of the the forearm after sitting with her arm propped out to the side, if she forgets her shoulder hurts and she does a motion with her arm it can make her arm ache.  Aggravating factors: using arm, abducting arm, pain when trying to  move at night (not before).  Relieving factors: avoiding use of L UE, hot pack, aleve, lidocaine  patch, Voltaren  PRECAUTIONS: None  WEIGHT BEARING RESTRICTIONS: No  FALLS:  Has patient fallen in last 6 months? Yes. Number of falls 1 when roller skating (okay now)  PLOF: no functional limitations from left shoulder or UE  PATIENT GOALS: Would love to not hurt and get some range of motion back.   NEXT MD VISIT:   OBJECTIVE  Vitals:   05/18/24 1722  BP: 127/79  Pulse: 88  SpO2: 100%    Repeated Movement Testing Pretest symptoms (in sitting with lumbar roll): pain into left UE Test Movement Symptom During Symptom After Mechanical Response Key Functional Test  Repeated retraction in sitting with clinician OP 4x6 decreasing Better, flush of nausea and heat after 4th set.  Improving L left cervical spine rotation   Repeated retraction in sitting with clinician OP 1x5 Requested PT stop after 5th rep due to fear vasovagal  response would return                                                                                                                                          TREATMENT  Manual therapy: to reduce pain and tissue tension, improve range of motion, neuromodulation, in order to promote improved ability to complete functional activities. SEATED Repeated cervical retraction with clinician OP  4x6 (See chart above) Nausea, and feeling hot after 4th set Provided water and cold pack Now feeling thick over temples then getting better.  Pain between shoulder blades gone but feeling cold of pack, still hurts to move arms 1x5 (see chart above): requested PT stop after 5th rep out of concern vasovagal like symtpoms would return.   Blood pressure/vitals measurement before last set due to not feeling well (see above). Education on response to manual therapy.   Therapeutic exercise: therapeutic exercises that incorporate ONE parameter at one or more areas of the body to centralize symptoms, develop strength and endurance, range of motion, and flexibility required for successful completion of functional activities. Education on recommendation to follow up with Dr. Kathlynn, POC, HEP, and visceral vs musculoskeletal symptoms and how they fit with her situation (as well as what to watch out for that would suggest need for more urgent care).     PATIENT EDUCATION: Education details: Education on recommendation to follow up with Dr. Kathlynn, POC, HEP, and visceral vs musculoskeletal symptoms and how they fit with her situation (as well as what to watch out for that would suggest need for more urgent care).  Person educated: Patient Education method: Explanation, Demonstration, Tactile cues, Verbal cues, and Handouts Education comprehension: verbalized understanding and needs further education  HOME EXERCISE PROGRAM: Access Code: [email or web address] URL: [email or web address] Date: 05/17/2024 Prepared by:  Camie Cleverly  Exercises - Seated Posture with Lumbar Roll  - Cervical Retraction with Overpressure  - 2 sets - 10 reps - 1 second hold - every 2 hours frequency - Standing Shoulder External Rotation Stretch in Doorway  - 3 x daily - 1 sets - 20 reps  HOME EXERCISE PROGRAM [[id number]]  Prayer Stretch -  Repeat 20 Repetitions, Hold 1 Second(s), Complete 2 Sets, Perform 3 Times a Day  ASSESSMENT:  CLINICAL IMPRESSION: Patient returns with new pain down her thoracic spine that worsened since midday and reporting a much higher pain burden than before despite reporting of continued behavior that prior to this decreased her symptoms significantly (limiting L arm use with a strap at her side) except addition of exercises from last visit (2 days ago) to attempt to get end range L shoulder flexion and ER stretch without irritating nerve tension sensitivity. However, she reported being unable to perform all reps or sets due to increasing concordant pain in her arm so she stopped immediately, and the onset of the thoracic pain happened hours after she did her last set of gentle ROM exercise for the L UE. In clinic, she responded to repeated cervical retraction with clinician overpressure with significnat reduction in pain into her upper back and improved left rotation of the cervical spine, strongly suggesting pain is coming from cervical spine. Based on where her pain started near the CT junction and palpable tenderness and curvature of the spine, most likely from lower to mid cervical spine. Unfortunately, she had a vasomotor response with a transient feeling of nausea, heat, and headache that prevented her from continuing with the intervention that was improving her symptoms today. This is a known response sometimes to repeated motions interventions with clinican overpressure to the spine. She recovered from this feeling with some seated rest, water to drink, and placement of a cold pack over her upper back and  shoulders. She reported feeling better than arrival overall and her countenance and demeanor improved. However, she has not improved overall since starting PT and would benefit from further medical follow up with potential imaging at the neck and possibly left shoulder. PT in agreement that she should seek further advice from Dr. Kathlynn (and possible referral to spinal specialist physician) due to no significant improvement in function at this point and continued worsening of symptoms that appear to be coming from the neck. Patient would benefit from continued management of limiting condition by skilled physical therapist to address remaining impairments and functional limitations to work towards stated goals and return to PLOF or maximal functional independence.   Mechanical sensitivities: cervical load, L neural tension (all nerves) MDT/McKenzie classification: cervical derangement syndrome with extension directional preference responding to clinician forces in loaded position.   From initial PT evaluation on 04/26/24:  Patient is a 53 y.o. female referred to outpatient physical therapy with a medical diagnosis of L shoulder tendinitis, shoulder impingement  who presents with signs and symptoms consistent with left shoulder pain, most likely referred or at least partially referred from the cervical spine. Patient has insidious onset, prior history of left sided neck pain, numbness and tingling in the L UE, non-RTC pattern of pain, and empty end feel with PROM, and positive traction alleviation test (improved pain, ROM, and myotome with/after  cervical traction) that suggested cervical spine contribution. She may also have local shoulder joint problem that would be most likely extra-articular based on testing up to this point which was limited due to empty end feel and pain limiting motion more than joint restriction. Plan to address contributions from the spine and work on improving impairments and functional  limitations at the shoulder to help patient return to prior level of functioning. Patient presents with significant pain, ROM, paresthesia, knowledge, muscle performance (strength/power/endurance), and activity tolerance impairments that are limiting ability to complete anything that requires the use of the L UE, working, artwork, putting on seat belt, reaching, carrying, lifting, closing/opening door, sleeping, bed mobility without difficulty. Patient will benefit from skilled physical therapy intervention to address current body structure impairments and activity limitations to improve function and work towards goals set in current POC in order to return to prior level of function or maximal functional improvement.    OBJECTIVE IMPAIRMENTS: decreased activity tolerance, decreased endurance, decreased knowledge of condition, decreased ROM, decreased strength, hypomobility, increased muscle spasms, impaired flexibility, impaired UE functional use, and pain.   ACTIVITY LIMITATIONS: carrying, lifting, sleeping, bed mobility, dressing, reach over head, and hygiene/grooming  PARTICIPATION LIMITATIONS: meal prep, cleaning, laundry, interpersonal relationship, driving, shopping, community activity, occupation, and church  PERSONAL FACTORS: Fitness, Past/current experiences, Time since onset of injury/illness/exacerbation, and 3+ comorbidities: prior left sided neck pain,Anemia, Depression, she  has a past surgical history that includes Breast biopsy (Right, 2010); Breast excisional biopsy (Right, 1997); Breast biopsy (Left, 2016); Breast reduction surgery (Bilateral, 04/14/2022); Cholecystectomy; Debridement and closure wound (Right, 05/15/2022); are also affecting patient's functional outcome.   REHAB POTENTIAL: Good  CLINICAL DECISION MAKING: Evolving/moderate complexity  EVALUATION COMPLEXITY: Moderate   GOALS: Goals reviewed with patient? No  SHORT TERM GOALS: Target date: 05/10/2024  Patient  will be independent with initial home exercise program for self-management of symptoms. Baseline: Initial HEP provided at IE (04/26/24); Goal status: In progress  LONG TERM GOALS: Target date: 07/19/2024  Patient will be independent with a long-term home exercise program for self-management of symptoms.  Baseline: Initial HEP provided at IE (04/26/24); Goal status: In progress  2.  Patient will demonstrate improved Upper Extremity Functional Scale (UEFS) to equal or greater than 64/80 to demonstrate improvement in overall condition and self-reported functional ability.  Baseline: 54/80 (04/26/24); Goal status: In progress  3.  Patient will demonstrate L shoulder AROM equal to R shoulder AROM or norms (whichever is less) without increased pain except end range intermittant discomfort to improve her ability to complete reaching tasks with her L UE. Baseline: Limited and painful - see objective (04/26/24); Goal status: In progress  4.  Patient will demonstrate L UE MMT at least equal to R UE MMT without increased pain to improve her ability to complete lifting, pushing, and pulling activities with L UE. Baseline: painful, full testing to be completed at later date - see objective exam (04/26/24); Goal status: In progress  5.  Patient will demonstrate improvement in Patient Specific Functional Scale (PSFS) of equal or greater than 8/10 points to reflect clinically significant improvement in patient's most valued functional activities. Baseline: to be measured at visit 2 as appropriate (04/26/24); 4/10 (05/01/24);  Goal status: In progress  6.  Patient will report NPRS equal or less than 3/10 during functional activities during the last 2 weeks to improve their abilitly to complete community, work and/or recreational activities with less limitation. Baseline: 8/10 (04/26/24); Goal status: In progress  PLAN:  PT FREQUENCY: 2x/week  PT DURATION: 8-12 weeks  PLANNED INTERVENTIONS: 02835-  PT Re-evaluation, 97750- Physical Performance Testing, 97110-Therapeutic exercises, 97530- Therapeutic activity, [id number]- Neuromuscular re-education, 97535- Self Care, 02859- Manual therapy, [id number]- Electrical stimulation (unattended), 20560 (1-2 muscles), 20561 (3+ muscles)- Dry Needling, Patient/Family education, Cryotherapy, and Moist heat  PLAN FOR NEXT SESSION: continue interventions at the neck, attempt to stop using strap on arm to prevent stiffness and disuse atrophy, continue to modify activities to decrease irritation while maximizing function and working on improving ROM without stretching nerves. update HEP as appropriate, education on mechanical stressors and modifications of activities to avoid them, recover motor control and awareness of modifiers to mechanical sensitivities, retrain motor patterns, regain ROM, improve strength and resilience needed for  performing desired functional performance with appropriate ROM, strength, power, and endurance. Manual therapy as needed.   Camie SAUNDERS. Juli, PT, DPT, Cert. MDT 05/18/24, 8:24 PM  [facility] [address] [place], [place] [address] P: [phone] I F: [phone]

## 2024-05-23 ENCOUNTER — Ambulatory Visit: Admitting: Physical Therapy

## 2024-05-25 ENCOUNTER — Ambulatory Visit: Admitting: Physical Therapy

## 2024-05-26 ENCOUNTER — Other Ambulatory Visit: Payer: Self-pay | Admitting: Orthopedic Surgery

## 2024-05-26 DIAGNOSIS — M542 Cervicalgia: Secondary | ICD-10-CM

## 2024-05-26 DIAGNOSIS — M75112 Incomplete rotator cuff tear or rupture of left shoulder, not specified as traumatic: Secondary | ICD-10-CM

## 2024-05-30 ENCOUNTER — Ambulatory Visit
Admission: RE | Admit: 2024-05-30 | Discharge: 2024-05-30 | Disposition: A | Discharge status: Home / Self Care | Source: Ambulatory Visit | Attending: Orthopedic Surgery | Admitting: Orthopedic Surgery

## 2024-05-30 ENCOUNTER — Ambulatory Visit: Admitting: Physical Therapy

## 2024-05-30 DIAGNOSIS — M542 Cervicalgia: Secondary | ICD-10-CM

## 2024-05-30 DIAGNOSIS — M75112 Incomplete rotator cuff tear or rupture of left shoulder, not specified as traumatic: Secondary | ICD-10-CM | POA: Diagnosis present

## 2024-06-01 ENCOUNTER — Ambulatory Visit: Admitting: Physical Therapy

## 2024-06-06 ENCOUNTER — Ambulatory Visit: Admitting: Physical Therapy

## 2024-06-12 ENCOUNTER — Ambulatory Visit: Admitting: Physical Therapy

## 2024-06-13 ENCOUNTER — Ambulatory Visit: Admitting: Physical Therapy
